# Patient Record
Sex: Male | Born: 1960
Health system: Southern US, Community
[De-identification: ages and names within clinical notes are randomized; demographics above are authoritative.]

## PROBLEM LIST (undated history)

## (undated) DIAGNOSIS — M549 Dorsalgia, unspecified: Secondary | ICD-10-CM

## (undated) DIAGNOSIS — T7840XA Allergy, unspecified, initial encounter: Secondary | ICD-10-CM

## (undated) DIAGNOSIS — R7301 Impaired fasting glucose: Secondary | ICD-10-CM

## (undated) DIAGNOSIS — I10 Essential (primary) hypertension: Secondary | ICD-10-CM

## (undated) DIAGNOSIS — Z973 Presence of spectacles and contact lenses: Secondary | ICD-10-CM

## (undated) DIAGNOSIS — E785 Hyperlipidemia, unspecified: Secondary | ICD-10-CM

## (undated) DIAGNOSIS — E782 Mixed hyperlipidemia: Secondary | ICD-10-CM

## (undated) DIAGNOSIS — M1712 Unilateral primary osteoarthritis, left knee: Secondary | ICD-10-CM

## (undated) DIAGNOSIS — Z8249 Family history of ischemic heart disease and other diseases of the circulatory system: Secondary | ICD-10-CM

## (undated) DIAGNOSIS — E669 Obesity, unspecified: Secondary | ICD-10-CM

## (undated) HISTORY — DX: Impaired fasting glucose: R73.01

## (undated) HISTORY — DX: Obesity, unspecified: E66.9

## (undated) HISTORY — DX: Unilateral primary osteoarthritis, left knee: M17.12

## (undated) HISTORY — DX: Dorsalgia, unspecified: M54.9

## (undated) HISTORY — DX: Family history of ischemic heart disease and other diseases of the circulatory system: Z82.49

## (undated) HISTORY — DX: Allergy, unspecified, initial encounter: T78.40XA

## (undated) HISTORY — DX: Presence of spectacles and contact lenses: Z97.3

## (undated) HISTORY — DX: Essential (primary) hypertension: I10

## (undated) HISTORY — DX: Mixed hyperlipidemia: E78.2

## (undated) HISTORY — PX: WISDOM TOOTH EXTRACTION: SHX21

## (undated) HISTORY — PX: KNEE CARTILAGE SURGERY: SHX688

## (undated) HISTORY — PX: TONSILLECTOMY: SUR1361

## (undated) HISTORY — PX: OTHER SURGICAL HISTORY: SHX169

---
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
## 1988-12-02 HISTORY — PX: ELBOW SURGERY: SHX618

## 1998-12-02 HISTORY — PX: OTHER SURGICAL HISTORY: SHX169

## 2000-06-13 ENCOUNTER — Encounter: Payer: Self-pay | Admitting: Orthopedic Surgery

## 2000-06-13 ENCOUNTER — Ambulatory Visit (HOSPITAL_COMMUNITY): Admission: RE | Admit: 2000-06-13 | Discharge: 2000-06-13 | Payer: Self-pay | Admitting: Orthopedic Surgery

## 2003-12-03 DIAGNOSIS — E782 Mixed hyperlipidemia: Secondary | ICD-10-CM

## 2003-12-03 HISTORY — DX: Mixed hyperlipidemia: E78.2

## 2008-12-02 HISTORY — PX: COLONOSCOPY: SHX174

## 2014-04-04 ENCOUNTER — Encounter (HOSPITAL_COMMUNITY): Payer: Self-pay | Admitting: Emergency Medicine

## 2014-04-04 ENCOUNTER — Emergency Department (HOSPITAL_COMMUNITY)
Admission: EM | Admit: 2014-04-04 | Discharge: 2014-04-04 | Disposition: A | Discharge status: Home / Self Care | Payer: 59 | Source: Home / Self Care

## 2014-04-04 DIAGNOSIS — M6740 Ganglion, unspecified site: Secondary | ICD-10-CM

## 2014-04-04 NOTE — ED Notes (Signed)
Cyst on R thumb x 3 weeks.  No known injury.  Not painful.

## 2014-04-04 NOTE — Discharge Instructions (Signed)
Ganglion Cyst °A ganglion cyst is a noncancerous, fluid-filled lump that occurs near joints or tendons. The ganglion cyst grows out of a joint or the lining of a tendon. It most often develops in the hand or wrist but can also develop in the shoulder, elbow, hip, knee, ankle, or foot. The round or oval ganglion can be pea sized or larger than a grape. Increased activity may enlarge the size of the cyst because more fluid starts to build up.  °CAUSES  °It is not completely known what causes a ganglion cyst to grow. However, it may be related to: °· Inflammation or irritation around the joint. °· An injury. °· Repetitive movements or overuse. °· Arthritis. °SYMPTOMS  °A lump most often appears in the hand or wrist, but can occur in other areas of the body. Generally, the lump is painless without other symptoms. However, sometimes pain can be felt during activity or when pressure is applied to the lump. The lump may even be tender to the touch. Tingling, pain, numbness, or muscle weakness can occur if the ganglion cyst presses on a nerve. Your grip may be weak and you may have less movement in your joints.  °DIAGNOSIS  °Ganglion cysts are most often diagnosed based on a physical exam, noting where the cyst is and how it looks. Your caregiver will feel the lump and may shine a light alongside it. If it is a ganglion, a light often shines through it. Your caregiver may order an X-ray, ultrasound, or MRI to rule out other conditions. °TREATMENT  °Ganglions usually go away on their own without treatment. If pain or other symptoms are involved, treatment may be needed. Treatment is also needed if the ganglion limits your movement or if it gets infected. Treatment options include: °· Wearing a wrist or finger brace or splint. °· Taking anti-inflammatory medicine. °· Draining fluid from the lump with a needle (aspiration). °· Injecting a steroid into the joint. °· Surgery to remove the ganglion cyst and its stalk that is  attached to the joint or tendon. However, ganglion cysts can grow back. °HOME CARE INSTRUCTIONS  °· Do not press on the ganglion, poke it with a needle, or hit it with a heavy object. You may rub the lump gently and often. Sometimes fluid moves out of the cyst. °· Only take medicines as directed by your caregiver. °· Wear your brace or splint as directed by your caregiver. °SEEK MEDICAL CARE IF:  °· Your ganglion becomes larger or more painful. °· You have increased redness, red streaks, or swelling. °· You have pus coming from the lump. °· You have weakness or numbness in the affected area. °MAKE SURE YOU:  °· Understand these instructions. °· Will watch your condition. °· Will get help right away if you are not doing well or get worse. °Document Released: 11/15/2000 Document Revised: 08/12/2012 Document Reviewed: 01/12/2008 °[employer]® Patient Information ©2014 [employer]. ° °

## 2014-04-04 NOTE — ED Provider Notes (Signed)
CSN: [id number]     Arrival date & time 04/04/14  1633 History   None    Chief Complaint  Patient presents with  . Cyst   (Consider location/radiation/quality/duration/timing/severity/associated sxs/prior Treatment) HPI Comments: Patient reports mildly tender cyst at right thumb x 3 weeks. 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 No known injury or previous episode.  PCP: none Patient is right hand dominant and is Glass blower/designer at [employer].   The history is provided by the patient.    Past Medical History  Diagnosis Date  . High cholesterol    Past Surgical History  Procedure Laterality Date  . Knee cartilage surgery Bilateral 2003, 2005      x 2, R 2005, L 2003  . Elbow Right     cartileg 1980, 1984, 1989  . Elbow surgery Right 1990    move nerve  . Wrist sugery Right 2000     bone spur   Family History  Problem Relation Age of Onset  . Alzheimer's disease Mother   . Hypertension Father    History  Substance Use Topics  . Smoking status: Never Smoker   . Smokeless tobacco: Not on file  . Alcohol Use: Yes     Comment: occasional    Review of Systems  All other systems reviewed and are negative.   Allergies  Review of patient's allergies indicates no known allergies.  Home Medications   Prior to Admission medications   Not on File   BP 157/87  Pulse 87  Temp(Src) 99.2 F (37.3 C) (Oral)  Resp 18  SpO2 97% Physical Exam  Nursing note and vitals reviewed. Constitutional: He is oriented to person, place, and time. He appears well-developed and well-nourished. No distress.  HENT:  Head: Normocephalic and atraumatic.  Eyes: Conjunctivae are normal. No scleral icterus.  Cardiovascular: Normal rate.   Pulmonary/Chest: Effort normal.  Musculoskeletal: Normal range of motion.       Hands: Neurological: He is alert and oriented to person, place, and time.    Skin: Skin is warm and dry. No rash noted.  Psychiatric: He has a normal mood and affect. His behavior is normal.    ED Course  Procedures (including critical care time) Labs Review Labs Reviewed - No data to display  Imaging Review No results found.   MDM   1. Ganglion cyst    No evidence of skin abscess or need for urgent I&D. No evidence of infection or fluctuance. Will refer to hand surgery if patient wishes to have lesion biopsied or removed.   [place], [place] 04/04/14 [phone]

## 2014-04-05 NOTE — ED Provider Notes (Signed)
Medical screening examination/treatment/procedure(s) were performed by resident physician or non-physician practitioner and as supervising physician I was immediately available for consultation/collaboration.   Pauline Good MD.   Billy Fischer, MD 04/05/14 [phone]

## 2014-08-02 ENCOUNTER — Ambulatory Visit (INDEPENDENT_AMBULATORY_CARE_PROVIDER_SITE_OTHER): Payer: 59 | Admitting: Medical

## 2014-08-02 ENCOUNTER — Encounter: Payer: Self-pay | Admitting: Medical

## 2014-08-02 VITALS — BP 122/80 | HR 78 | Temp 98.3°F | Resp 14 | Ht 72.0 in | Wt 228.0 lb

## 2014-08-02 DIAGNOSIS — Z23 Encounter for immunization: Secondary | ICD-10-CM

## 2014-08-02 DIAGNOSIS — Z125 Encounter for screening for malignant neoplasm of prostate: Secondary | ICD-10-CM

## 2014-08-02 DIAGNOSIS — Z0000 Encounter for general adult medical examination without abnormal findings: Secondary | ICD-10-CM

## 2014-08-02 DIAGNOSIS — Z8249 Family history of ischemic heart disease and other diseases of the circulatory system: Secondary | ICD-10-CM

## 2014-08-02 DIAGNOSIS — E782 Mixed hyperlipidemia: Secondary | ICD-10-CM

## 2014-08-02 DIAGNOSIS — Z6830 Body mass index (BMI) 30.0-30.9, adult: Secondary | ICD-10-CM

## 2014-08-02 LAB — CBC
HCT: 46.3 % (ref 39.0–52.0)
Hemoglobin: 15.9 g/dL (ref 13.0–17.0)
MCH: 28.4 pg (ref 26.0–34.0)
MCHC: 34.3 g/dL (ref 30.0–36.0)
MCV: 82.7 fL (ref 78.0–100.0)
Platelets: 271 10*3/uL (ref 150–400)
RBC: 5.6 MIL/uL (ref 4.22–5.81)
RDW: 13.4 % (ref 11.5–15.5)
WBC: 7.3 10*3/uL (ref 4.0–10.5)

## 2014-08-02 LAB — POCT URINALYSIS DIPSTICK
Bilirubin, UA: NEGATIVE
Blood, UA: NEGATIVE
Clarity, UA: NEGATIVE
Glucose, UA: NEGATIVE
Ketones, UA: NEGATIVE
Leukocytes, UA: NEGATIVE
Nitrite, UA: NEGATIVE
Protein, UA: NEGATIVE
Spec Grav, UA: <=1.005
Urobilinogen, UA: NEGATIVE
pH, UA: 5

## 2014-08-02 NOTE — Progress Notes (Addendum)
Subjective:   HPI  Steven Fletcher is a 53 y.o. male who presents for a complete physical.  New patient today.   Was living in [place], moved back to [place] recently.    Preventative care: Last ophthalmology visit:looking for new doctor- last eye exam 2014 Last dental visit:yes appointment 12/2014 Last colonoscopy:5 years ago in [place] Last prostate exam: ? Last VPX:TGGYI ago Last labs:new  Prior vaccinations: TD or Tdap:08/02/14 Influenza:will get at his job Pneumococcal:n/a Shingles/Zostavax:n/a  Advanced directive:yes Health care power of attorney:yes Living will:yes  Concerns: Few skin lesions - right chest, left cheek, left thigh, x few years, unchanged.  Reviewed their medical, surgical, family, social, medication, and allergy history and updated chart as appropriate.  Past Medical History  Diagnosis Date  . Mixed dyslipidemia 2005    hereditary  . Wears glasses   . Allergy     seasonal   . Back pain     intermittent pain, sees Dr. Limmie Patricia, Chiropractic    Past Surgical History  Procedure Laterality Date  . Knee cartilage surgery Bilateral 2003, 2005      x 2, R 2005, L 2003  . Elbow Right     arthroscopic for cartilege 1980, 1984, 1989  . Elbow surgery Right 1990    move nerve  . Wrist sugery Right 2000     bone spur  . Tonsillectomy    . Wisdom tooth extraction    . Colonoscopy  2010    [place]; normal, repeat 2020    History   Social History  . Marital Status: Legally Separated    Spouse Name: N/A    Number of Children: N/A  . Years of Education: N/A   Occupational History  . 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 Smoking status: Never Smoker   . Smokeless tobacco: Not on file  . Alcohol Use: 0.6 oz/week    1 Cans of beer per week     Comment: occasional  . Drug Use: No  . Sexual Activity: Not Currently   Other Topics Concern  . Not on file   Social History Narrative   Exercise - [employer], walks,  bikes, Engineer, structural.    Family History  Problem Relation Age of Onset  . Alzheimer's disease Mother   . Hyperlipidemia Mother   . Hypertension Father   . Other Father     died of old age  . Cancer Sister     breast  . Hyperlipidemia Sister   . Heart disease Brother 11    died of MI  . Hyperlipidemia Brother   . Diabetes Maternal Grandmother   . Diabetes Paternal Grandmother   . Stroke Neg Hx     Current outpatient prescriptions:gemfibrozil (LOPID) 600 MG tablet, Take 600 mg by mouth 2 (two) times daily before a meal., Disp: , Rfl: ;  niacin (NIASPAN) 1000 MG CR tablet, Take 1,000 mg by mouth at bedtime., Disp: , Rfl:   No Known Allergies     Review of Systems Constitutional: -fever, -chills, -sweats, -unexpected weight change, -decreased appetite, -fatigue Allergy: -sneezing, -itching, -congestion Dermatology: -changing moles, --rash, +lumps ENT: -runny nose, -ear pain, -sore throat, -hoarseness, -sinus pain, -teeth pain, - ringing in ears, -hearing loss, -nosebleeds Cardiology: -chest pain, -palpitations, -swelling, -difficulty breathing when lying flat, -waking up short of breath Respiratory: -cough, -shortness of breath, -difficulty breathing with exercise or exertion, -wheezing, -coughing up blood Gastroenterology: -abdominal pain, -nausea, -vomiting, -diarrhea, -constipation, -blood in  stool, -changes in bowel movement, -difficulty swallowing or eating Hematology: -bleeding, -bruising  Musculoskeletal: -joint aches, -muscle aches, -joint swelling, -back pain, -neck pain, -cramping, -changes in gait Ophthalmology: denies vision changes, eye redness, itching, discharge Urology: -burning with urination, -difficulty urinating, -blood in urine, -urinary frequency, -urgency, -incontinence Neurology: -headache, -weakness, -tingling, -numbness, -memory loss, -falls, -dizziness Psychology: -depressed mood, -agitation, -sleep problems     Objective:   Physical Exam  BP  122/80  Pulse 78  Temp(Src) 98.3 F (36.8 C) (Oral)  Resp 14  Ht 6' (1.829 m)  Wt 228 lb (103.42 kg)  BMI 30.92 kg/m2   General appearance: alert, no distress, WD/WN, white male Skin: scattered macules, left anterior thigh with 2 small yellow/brown 35mm slightly raised papules, similar right chest upper wall yellow brown 13mm faint raised broad topped lesion, no particular worrisome lesions HEENT: normocephalic, conjunctiva/corneas normal, sclerae anicteric, PERRLA, EOMi, nares patent, no discharge or erythema, pharynx normal Oral cavity: MMM, tongue normal, teeth normal Neck: supple, no lymphadenopathy, no thyromegaly, no masses, normal ROM, no bruits Chest: non tender, normal shape and expansion Heart: RRR, normal S1, S2, no murmurs Lungs: CTA bilaterally, no wheezes, rhonchi, or rales Abdomen: +bs, soft, non tender, non distended, no masses, no hepatomegaly, no splenomegaly, no bruits Back: non tender, normal ROM, no scoliosis Musculoskeletal: right dorsal wrist laterally with small surgical scar, right medial and lateral elbow surgical scars,  anterior port surgical scars both knees, upper extremities non tender, no obvious deformity, normal ROM throughout, lower extremities non tender, no obvious deformity, normal ROM throughout Extremities: no edema, no cyanosis, no clubbing Pulses: 2+ symmetric, upper and lower extremities, normal cap refill Neurological: alert, oriented x 3, CN2-12 intact, strength normal upper extremities and lower extremities, sensation normal throughout, DTRs 2+ throughout, no cerebellar signs, gait normal Psychiatric: normal affect, behavior normal, pleasant  GU: normal male external genitalia, circumcised, nontender, no masses, no hernia, no lymphadenopathy Rectal: anus normal, normal tone, prostate WNL, occult negative stool   Adult ECG Report  Indication: premature family hx/o heart disease, physical  Rate: 73 bpm  Rhythm: normal sinus rhythm  QRS Axis:  51 degrees  PR Interval: 188bpm  QRS Duration: 112 ms  QTc: 414ms  Conduction Disturbances: none  Other Abnormalities: none  Patient's cardiac risk factors are: advanced age (older than 39 for men, 72 for women), dyslipidemia, family history of premature cardiovascular disease, male gender and obesity (BMI >= 30 kg/m2).  EKG comparison: none  Narrative Interpretation: normal EKG     Assessment and Plan :      Encounter Diagnoses  Name Primary?  . Routine general medical examination at a health care facility Yes  . Family history of premature coronary artery disease   . Mixed dyslipidemia   . Screening for prostate cancer   . Need for Tdap vaccination   . BMI 30.0-30.9,adult     Physical exam - discussed healthy lifestyle, diet, exercise, preventative care, vaccinations, and addressed their concerns.    See eye doctor and dentist yearly.  Flu shot at work soon.  Counseled on the Tdap (tetanus, diptheria, and acellular pertussis) vaccine.  Vaccine information sheet given. Tdap vaccine given after consent obtained.  Work on some weight loss through healthy diet and exercise.  Labs today.  Follow-up pending labs

## 2014-08-02 NOTE — Patient Instructions (Signed)
Thank you for giving me the opportunity to serve you today.    Your diagnosis today includes: Encounter Diagnoses  Name Primary?  . Routine general medical examination at a health care facility Yes  . Family history of premature coronary artery disease   . Mixed dyslipidemia   . Screening for prostate cancer   . Need for Tdap vaccination   . BMI 30.0-30.9,adult      Specific recommendations today include:  Work on losing a little weight through healthy diet and exercise  See the eye doctor and dentist yearly  We updated your tetanus, diphtheria, and pertussis vaccine today which is good for 10 years  We will call lab results and additional information  Return pending labs.    I have included other useful information below for your review.  Dyslipidemia Dyslipidemia is an imbalance of the lipids in your blood. Lipids are waxy, fat-like proteins that your body needs in small amounts. Dyslipidemia often involves the lipids cholesterol or triglycerides. Common forms of dyslipidemia are:  High levels of bad cholesterol (LDL cholesterol). LDL cholesterol is the type of cholesterol that causes heart disease.  Low levels of good cholesterol (HDL cholesterol). HDL cholesterol is the type of cholesterol that helps protect against heart disease.  High levels of triglycerides. Triglycerides are a fatty substance in the blood linked to a buildup of plaque on your arteries. RISK FACTORS  Increased age.  Having a family history of high cholesterol.  Certain medicines, including birth control pills, diuretics, beta-blockers, and some medicines for depression.  Smoking.  Eating a high-fat diet.  Being overweight.  Medical conditions such as diabetes, polycystic ovary syndrome, pregnancy, kidney disease, and hypothyroidism.  Lack of regular exercise. SIGNS AND SYMPTOMS There are no signs or symptoms with dyslipidemia.  DIAGNOSIS  A simple blood test called a fasting blood  test can be done to determine your level of:  Total cholesterol. This is the combined number of LDL cholesterol and HDL cholesterol. A healthy number is lower than 200.  LDL cholesterol. The goal number for LDL cholesterol is different for each person depending on risk factors. Ask your health care provider what your LDL cholesterol number should be.  HDL cholesterol. A healthy level of HDL cholesterol is 60 or higher. A number lower than 40 for men or 50 for women is a danger sign.  Triglycerides. A healthy triglyceride number is less than 150. TREATMENT  Dyslipidemia is a treatable condition. Your health care provider will advise you on what type of treatment is best based on your age, your test results, and current guidelines. Treatment may include:   Dietary changes. A dietitian can help you create a meal plan. You may need to:  Eat more foods that contain omega-3s, such as salmon and other fish.  Replace saturated fats and trans fats in your diet with healthy fats such as nuts, seeds, avocados, olive oil, and canola oil.  Regular exercise. This can help lower your LDL cholesterol, raise your HDL cholesterol, and help with weight management. Check with your health care provider before beginning an exercise program. Most people should participate in 30 minutes of brisk exercise 5 days a week.  Quitting smoking.  Medicines to lower LDL cholesterol and triglycerides. Your health care provider will monitor your lipid levels with regular blood tests. HOME CARE INSTRUCTIONS  Eat a healthy diet. Follow any diet instructions if they were given to you by your health care provider.  Maintain a healthy weight.  Exercise  regularly based on the recommendations of your health care provider.  Do not use any tobacco products, including cigarettes, chewing tobacco, or electronic cigarettes.  Take medicines only as directed by your health care provider.  Keep all follow-up visits as directed by  your health care provider. SEEK MEDICAL CARE IF: You are having possible side effects from your medicines. Document Released: 11/23/2013 Document Revised: 04/04/2014 Document Reviewed: 11/23/2013 [facility] Patient Information 2015 [place], [place]. This information is not intended to replace advice given to you by your health care provider. Make sure you discuss any questions you have with your health care provider.

## 2014-08-03 LAB — LIPID PANEL
Cholesterol: 265 mg/dL — ABNORMAL HIGH (ref 0–200)
HDL: 38 mg/dL — ABNORMAL LOW (ref >39)
LDL Cholesterol: 157 mg/dL — ABNORMAL HIGH (ref 0–99)
Total CHOL/HDL Ratio: 7 Ratio
Triglycerides: 349 mg/dL — ABNORMAL HIGH (ref <150)
VLDL: 70 mg/dL — ABNORMAL HIGH (ref 0–40)

## 2014-08-03 LAB — COMPREHENSIVE METABOLIC PANEL
ALT: 18 U/L (ref 0–53)
AST: 26 U/L (ref 0–37)
Albumin: 5 g/dL (ref 3.5–5.2)
Alkaline Phosphatase: 61 U/L (ref 39–117)
BUN: 17 mg/dL (ref 6–23)
CO2: 23 mEq/L (ref 19–32)
Calcium: 9.8 mg/dL (ref 8.4–10.5)
Chloride: 102 mEq/L (ref 96–112)
Creat: 0.84 mg/dL (ref 0.50–1.35)
Glucose, Bld: 109 mg/dL — ABNORMAL HIGH (ref 70–99)
Potassium: 4.6 mEq/L (ref 3.5–5.3)
Sodium: 137 mEq/L (ref 135–145)
Total Bilirubin: 0.7 mg/dL (ref 0.2–1.2)
Total Protein: 8 g/dL (ref 6.0–8.3)

## 2014-08-03 LAB — PSA: PSA: 2.97 ng/mL (ref <=4.00)

## 2014-08-04 ENCOUNTER — Other Ambulatory Visit: Payer: Self-pay | Admitting: Medical

## 2014-08-04 MED ORDER — NIACIN ER (ANTIHYPERLIPIDEMIC) 1000 MG PO TBCR: 1000.0000 mg | EXTENDED_RELEASE_TABLET | Freq: Every day | ORAL | Status: DC

## 2014-08-04 MED ORDER — ROSUVASTATIN CALCIUM 20 MG PO TABS: 20.0000 mg | ORAL_TABLET | Freq: Every day | ORAL | Status: DC

## 2014-10-19 ENCOUNTER — Telehealth: Payer: Self-pay | Admitting: Medical

## 2014-10-20 ENCOUNTER — Other Ambulatory Visit: Payer: Self-pay | Admitting: Medical

## 2014-10-20 DIAGNOSIS — R7301 Impaired fasting glucose: Secondary | ICD-10-CM

## 2014-10-20 DIAGNOSIS — E782 Mixed hyperlipidemia: Secondary | ICD-10-CM

## 2014-10-20 NOTE — Telephone Encounter (Signed)
Have him come in first FASTING for labs only/nurse visit, but make sure it has been over 6 weeks since he started the new cholesterol regimen: Niacin + Crestor.  Orders are in the computer

## 2014-10-20 NOTE — Telephone Encounter (Signed)
Left message for pt

## 2014-11-03 ENCOUNTER — Other Ambulatory Visit: Payer: 59

## 2014-11-03 DIAGNOSIS — R7301 Impaired fasting glucose: Secondary | ICD-10-CM

## 2014-11-03 DIAGNOSIS — E782 Mixed hyperlipidemia: Secondary | ICD-10-CM

## 2014-11-03 LAB — LIPID PANEL
Cholesterol: 158 mg/dL (ref 0–200)
HDL: 40 mg/dL (ref >39)
LDL CALC: 69 mg/dL (ref 0–99)
TRIGLYCERIDES: 245 mg/dL — AB (ref <150)
Total CHOL/HDL Ratio: 4 Ratio
VLDL: 49 mg/dL — ABNORMAL HIGH (ref 0–40)

## 2014-11-03 LAB — ALT: ALT: 31 U/L (ref 0–53)

## 2014-11-04 LAB — HEMOGLOBIN A1C
Hgb A1c MFr Bld: 6.1 % — ABNORMAL HIGH (ref <5.7)
Mean Plasma Glucose: 128 mg/dL — ABNORMAL HIGH (ref <117)

## 2014-11-07 ENCOUNTER — Encounter: Payer: Self-pay | Admitting: Medical

## 2014-11-10 ENCOUNTER — Encounter: Payer: Self-pay | Admitting: Medical

## 2014-11-10 ENCOUNTER — Ambulatory Visit (INDEPENDENT_AMBULATORY_CARE_PROVIDER_SITE_OTHER): Payer: 59 | Admitting: Medical

## 2014-11-10 VITALS — BP 138/98 | HR 78 | Temp 98.0°F | Resp 15 | Wt 230.0 lb

## 2014-11-10 DIAGNOSIS — Z8249 Family history of ischemic heart disease and other diseases of the circulatory system: Secondary | ICD-10-CM

## 2014-11-10 DIAGNOSIS — R030 Elevated blood-pressure reading, without diagnosis of hypertension: Secondary | ICD-10-CM

## 2014-11-10 DIAGNOSIS — E785 Hyperlipidemia, unspecified: Secondary | ICD-10-CM | POA: Insufficient documentation

## 2014-11-10 DIAGNOSIS — E669 Obesity, unspecified: Secondary | ICD-10-CM

## 2014-11-10 DIAGNOSIS — E782 Mixed hyperlipidemia: Secondary | ICD-10-CM

## 2014-11-10 DIAGNOSIS — R7301 Impaired fasting glucose: Secondary | ICD-10-CM

## 2014-11-10 MED ORDER — ROSUVASTATIN CALCIUM 20 MG PO TABS
20.0000 mg | ORAL_TABLET | Freq: Every day | ORAL | Status: DC
Start: 2014-11-10 — End: 2015-06-07

## 2014-11-10 MED ORDER — ICOSAPENT ETHYL 1 G PO CAPS: 2.0000 g | ORAL_CAPSULE | Freq: Two times a day (BID) | ORAL | Status: DC

## 2014-11-10 MED ORDER — ASPIRIN EC 81 MG PO TBEC: 81.0000 mg | DELAYED_RELEASE_TABLET | Freq: Every day | ORAL | Status: DC

## 2014-11-10 MED ORDER — NIACIN ER (ANTIHYPERLIPIDEMIC) 1000 MG PO TBCR: 1000.0000 mg | EXTENDED_RELEASE_TABLET | Freq: Every day | ORAL | Status: DC

## 2014-11-10 NOTE — Patient Instructions (Signed)
Thank you for giving me the opportunity to serve you today.    Your diagnosis today includes: Encounter Diagnoses  Name Primary?  . Mixed dyslipidemia Yes  . Impaired fasting blood sugar   . Family history of premature CAD   . Elevated blood pressure reading without diagnosis of hypertension   . Obesity      Specific recommendations today include:  I am glad to see the improvements in the lipids.  You do have elevated glucose, which puts you borderline for diabetes  Blood pressure is also elevated today  I want you to continue working to improve on exercise, diet, and losing some weight  Continue Crestor, continue Niaspan, continue Aspirin daily 81mg   Begin Vasecpa, 2 capsules twice daily to help lower triglycerides and to improve the HDL cholesterol  Lets see you back in 6 months, fasting  Return 31mo.    I have included other useful information below for your review.  Metabolic Syndrome, Adult Metabolic syndrome describes a group of risk factors for heart disease and diabetes. This syndrome has other names including Insulin Resistance Syndrome. The more risk factors you have, the higher your risk of having a heart attack, stroke, or developing diabetes. These risk factors include:  High blood sugar.  High blood triglyceride (a fat found in the blood) level.  High blood pressure.  Abdominal obesity (your extra weight is around your waist instead of your hips).  Low levels of high-density lipoprotein, HDL (good blood cholesterol). If you have any three of these risk factors, you have metabolic syndrome. If you have even one of these factors, you should make lifestyle changes to improve your health in order to prevent serious health diseases.  In people with metabolic syndrome, the cells do not respond properly to insulin. This can lead to high levels of glucose in the blood, which can interfere with normal body processes. Eventually, this can cause high blood  pressure and higher fat levels in the blood, and inflammation of your blood vessels. The result can be heart disease and stroke.  CAUSES   Eating a diet rich in calories and saturated fat.  Too little physical activity.  Being overweight. Other underlying causes are:  Family history (genetics).  Ethnicity (South Asians are at a higher risk).  Older age (your chances of developing metabolic syndrome are higher as you grow older).  Insulin resistance. SYMPTOMS  By itself, metabolic syndrome has no symptoms. However, you might have symptoms of diabetes (high blood sugar) or high blood pressure, such as:  Increased thirst, urination, and tiredness.  Dizzy spells.  Dull headaches that are unusual for you.  Blurred vision.  Nosebleeds. DIAGNOSIS  Your caregiver may make a diagnosis of metabolic syndrome if you have at least three of these factors:  If you are overweight mostly around the waist. This means a waistline greater than 40" in men and more than 35" in women. The waistline limits are 31 to 35 inches for women and 37 to 39 inches for men. In those who have certain genetic risk factors, such as having a family history of diabetes or being of Asian descent.  If you have a blood pressure of 130/85 mm Hg or more, or if you are being treated for high blood pressure.  If your blood triglyceride level is 150 mg/dL or more, or you are being treated for high levels of triglyceride.  If the level of HDL in your blood is below 40 mg/dL in men, less than 50  mg/dL in women, or you are receiving treatment for low levels of HDL.  If the level of sugar in your blood is high with fasting blood sugar level of 110 mg/dL or more, or you are under treatment for diabetes. TREATMENT  Your caregiver may have you make lifestyle changes, which may include:  Exercise.  Losing weight.  Maintaining a healthy diet.  Quitting smoking. The lifestyle changes listed above are key in reducing your  risk for heart disease and stroke. Medicines may also be prescribed to help your body respond to insulin better and to reduce your blood pressure and blood fat levels. Aspirin may be recommended to reduce risks of heart disease or stroke.  HOME CARE INSTRUCTIONS   Exercise.  Measure your waist at regular intervals just above the hipbones after you have breathed out.  Maintain a healthy diet.  Eat fruits, such as apples, oranges, and pears.  Eat vegetables.  Eat legumes, such as kidney beans, peas, and lentils.  Eat food rich in soluble fiber, such as whole grain cereal, oatmeal, and oat bran.  Use olive or safflower oils and avoid saturated fats.  Eat nuts.  Limit the amount of salt you eat or add to food.  Limit the amount of alcohol you drink.  Include fish in your diet, if possible.  Stop smoking if you are a smoker.  Maintain regular follow-up appointments.  Follow your caregiver's advice. SEEK MEDICAL CARE IF:   You feel very tired or fatigued.  You develop excessive thirst.  You pass large quantities of urine.  You are putting on weight around your waist rather than losing weight.  You develop headaches over and over again.  You have off-and-on dizzy spells. SEEK IMMEDIATE MEDICAL CARE IF:   You develop nosebleeds.  You develop sudden blurred vision.  You develop sudden dizzy spells.  You develop chest pains, trouble breathing, or feel an abnormal or irregular heart beat.  You have a fainting episode.  You develop any sudden trouble speaking and/or swallowing.  You develop sudden weakness in one arm and/or one leg. MAKE SURE YOU:   Understand these instructions.  Will watch your condition.  Will get help right away if you are not doing well or get worse. Document Released: 02/25/2008 Document Revised: 04/04/2014 Document Reviewed: 02/25/2008 [facility] Patient Information 2015 [place], [place]. This information is not intended to replace advice  given to you by your health care provider. Make sure you discuss any questions you have with your health care provider.

## 2014-11-10 NOTE — Progress Notes (Signed)
Subjective: Here for f/u on lipids.  Came here as a new patient in September 2015.   After labs from that visit, we stopped Lopid, c/t Niaspan, and changed to Crestor.  Started aspirin 81mg  QHS.  Compliance - excellent.  Prior other medication include Lopid, Niaspan, Crestor, Lipitor, others too but can't recall the name.  No prior side effects of the medication.  Diet - mostly good, could be some better.  Tries to eat salad daily.  Exercise - working 6 days per week, 12 hour days.   Trying to get more exercise, after another week of 6 days per week work, plans to get back to exercise more.   Patient's cardiac risk factors are: advanced age (older than 36 for men, 26 for women), dyslipidemia, family history of premature cardiovascular disease, male gender, obesity (BMI >= 30 kg/m2) and prediabetic, low HDL.   Past Medical History  Diagnosis Date  . Mixed dyslipidemia 2005    hereditary  . Wears glasses   . Allergy     seasonal   . Back pain     intermittent pain, sees Dr. Limmie Patricia, Chiropractic   ROS as in subjective   Objective: Filed Vitals:   11/10/14 0825  BP: 138/98  Pulse: 78  Temp: 98 F (36.7 C)  Resp: 15   Wt Readings from Last 3 Encounters:  11/10/14 230 lb (104.327 kg)  08/02/14 228 lb (103.42 kg)   BP Readings from Last 3 Encounters:  11/10/14 138/98  08/02/14 122/80  04/04/14 157/87    General appearance: alert, no distress, WD/WN Neck: supple, no lymphadenopathy, no thyromegaly, no masses Heart: RRR, normal S1, S2, no murmurs Lungs: CTA bilaterally, no wheezes, rhonchi, or rales Abdomen: +bs, soft, non tender, non distended, no masses, no hepatomegaly, no splenomegaly Pulses: 2+ symmetric, upper and lower extremities, normal cap refill Ext: no edema    Assessment: Encounter Diagnoses  Name Primary?  . Mixed dyslipidemia Yes  . Impaired fasting blood sugar   . Obesity   . Family history of premature CAD    Plan: We discussed his recent labs, lipids  much improved with adding Crestor, but not at goal.   C/t current medications, begin Vascepa.  Specific recommendations today include:  I am glad to see the improvements in the lipids.  You do have elevated glucose, which puts you borderline for diabetes  Blood pressure is also elevated today  I want you to continue working to improve on exercise, diet, and losing some weight  Continue Crestor, continue Niaspan, continue Aspirin daily 81mg   Begin Vasecpa, 2 capsules twice daily to help lower triglycerides and to improve the HDL cholesterol  Lets see you back in 6 months, fasting  Return 87mo.

## 2015-05-24 ENCOUNTER — Telehealth: Payer: Self-pay | Admitting: Medical

## 2015-05-24 NOTE — Telephone Encounter (Signed)
Please call patient Patient is due for 6 month fasting follow up, he would like to come in for labs first and then see you Is that ok and if so can you enter orders?

## 2015-05-25 ENCOUNTER — Other Ambulatory Visit: Payer: Self-pay | Admitting: Medical

## 2015-05-25 DIAGNOSIS — Z125 Encounter for screening for malignant neoplasm of prostate: Secondary | ICD-10-CM

## 2015-05-25 DIAGNOSIS — E782 Mixed hyperlipidemia: Secondary | ICD-10-CM

## 2015-05-25 DIAGNOSIS — R7301 Impaired fasting glucose: Secondary | ICD-10-CM

## 2015-05-25 NOTE — Telephone Encounter (Signed)
Labs are in future orders

## 2015-05-26 ENCOUNTER — Other Ambulatory Visit: Payer: 59

## 2015-05-26 DIAGNOSIS — E782 Mixed hyperlipidemia: Secondary | ICD-10-CM

## 2015-05-26 DIAGNOSIS — Z125 Encounter for screening for malignant neoplasm of prostate: Secondary | ICD-10-CM

## 2015-05-26 DIAGNOSIS — R7301 Impaired fasting glucose: Secondary | ICD-10-CM

## 2015-05-26 LAB — CBC
HEMATOCRIT: 46 % (ref 39.0–52.0)
Hemoglobin: 15.9 g/dL (ref 13.0–17.0)
MCH: 28.8 pg (ref 26.0–34.0)
MCHC: 34.6 g/dL (ref 30.0–36.0)
MCV: 83.3 fL (ref 78.0–100.0)
MPV: 9.5 fL (ref 8.6–12.4)
Platelets: 240 10*3/uL (ref 150–400)
RBC: 5.52 MIL/uL (ref 4.22–5.81)
RDW: 13.2 % (ref 11.5–15.5)
WBC: 8.1 10*3/uL (ref 4.0–10.5)

## 2015-05-26 LAB — HEMOGLOBIN A1C
HEMOGLOBIN A1C: 6 % — AB (ref <5.7)
Mean Plasma Glucose: 126 mg/dL — ABNORMAL HIGH (ref <117)

## 2015-05-27 LAB — LIPID PANEL
CHOLESTEROL: 169 mg/dL (ref 0–200)
HDL: 36 mg/dL — ABNORMAL LOW (ref >=40)
LDL Cholesterol: 93 mg/dL (ref 0–99)
Total CHOL/HDL Ratio: 4.7 Ratio
Triglycerides: 199 mg/dL — ABNORMAL HIGH (ref <150)
VLDL: 40 mg/dL (ref 0–40)

## 2015-05-27 LAB — COMPREHENSIVE METABOLIC PANEL
ALBUMIN: 4.4 g/dL (ref 3.5–5.2)
ALT: 21 U/L (ref 0–53)
AST: 29 U/L (ref 0–37)
Alkaline Phosphatase: 61 U/L (ref 39–117)
BUN: 14 mg/dL (ref 6–23)
CALCIUM: 9.6 mg/dL (ref 8.4–10.5)
CHLORIDE: 100 meq/L (ref 96–112)
CO2: 28 mEq/L (ref 19–32)
CREATININE: 0.84 mg/dL (ref 0.50–1.35)
Glucose, Bld: 111 mg/dL — ABNORMAL HIGH (ref 70–99)
POTASSIUM: 4.4 meq/L (ref 3.5–5.3)
SODIUM: 140 meq/L (ref 135–145)
TOTAL PROTEIN: 7.4 g/dL (ref 6.0–8.3)
Total Bilirubin: 1 mg/dL (ref 0.2–1.2)

## 2015-05-27 LAB — PSA: PSA: 3.12 ng/mL (ref <=4.00)

## 2015-05-29 NOTE — Telephone Encounter (Signed)
I left the patient a voicemail about the labs are ready and he will need to schedule a lab visit.

## 2015-06-07 ENCOUNTER — Encounter: Payer: Self-pay | Admitting: Medical

## 2015-06-07 ENCOUNTER — Ambulatory Visit (INDEPENDENT_AMBULATORY_CARE_PROVIDER_SITE_OTHER): Payer: 59 | Admitting: Medical

## 2015-06-07 VITALS — BP 150/100 | HR 80 | Resp 14 | Wt 237.0 lb

## 2015-06-07 DIAGNOSIS — E669 Obesity, unspecified: Secondary | ICD-10-CM | POA: Diagnosis not present

## 2015-06-07 DIAGNOSIS — Z8249 Family history of ischemic heart disease and other diseases of the circulatory system: Secondary | ICD-10-CM

## 2015-06-07 DIAGNOSIS — E782 Mixed hyperlipidemia: Secondary | ICD-10-CM | POA: Diagnosis not present

## 2015-06-07 DIAGNOSIS — I10 Essential (primary) hypertension: Secondary | ICD-10-CM

## 2015-06-07 DIAGNOSIS — R5383 Other fatigue: Secondary | ICD-10-CM

## 2015-06-07 DIAGNOSIS — R7301 Impaired fasting glucose: Secondary | ICD-10-CM | POA: Diagnosis not present

## 2015-06-07 MED ORDER — ROSUVASTATIN CALCIUM 20 MG PO TABS: 20.0000 mg | ORAL_TABLET | Freq: Every day | ORAL | Status: DC

## 2015-06-07 MED ORDER — OMEGA-3-ACID ETHYL ESTERS 1 G PO CAPS: 2.0000 g | ORAL_CAPSULE | Freq: Two times a day (BID) | ORAL | Status: DC

## 2015-06-07 NOTE — Patient Instructions (Signed)
Recommendations  Continue Crestor daily at bedtime  Continue Aspirin daily at bedtime  STOP niacin as it is not working  Change from [place] to [place] and lets see if this is cheaper  Check blood pressure 3 times a week at random times and write these numbers down.   Goal is 130/80 or less  STOP zyrtec D and change to plain Zyrtec without the D  Eat healthy Mediterranean diet  Exercise daily  Consider a hobby  I invite you to church, [employer], get plugged in to things outside of work  Good luck on your job interviews today  We will call with lab results

## 2015-06-07 NOTE — Progress Notes (Signed)
Subjective: Here for f/u.  At his last visit in 11/2014, he continued Crestor and Niacin, started Vasecpa, continue Aspirin for mixed dyslipidemia and family hx/o premature CAD.  He has had elevated blood pressures last 2 visits.  Doesn't check BPs at home.      Takes zyrtec D for allergies, on average every other day.  Exercise - going to [employer], usually weekends, hard to make time for this due to work schedule, but does walk a lot at [employer].   Diet - not too bad, just got back from vacation, just went to [facility] with sister, ate quite a bit.     Has some stress in life.  No energy on the weekends.   Still trying to settle divorce after 3 years.   Not happy with work schedule, has interviews today.    He came here as a new patient in September 2015.   After labs from that visit, we stopped Lopid, c/t Niaspan, and changed to Crestor.  Prior other medication include Lopid, Niaspan, Crestor, Lipitor, others too but can't recall the name.  No prior side effects of the medication.   Patient's cardiac risk factors are: advanced age (older than 85 for men, 68 for women), dyslipidemia, family history of premature cardiovascular disease, male gender, obesity (BMI >= 30 kg/m2) and prediabetic, low HDL.   Past Medical History  Diagnosis Date  . Mixed dyslipidemia 2005    hereditary  . Wears glasses   . Allergy     seasonal   . Back pain     intermittent pain, sees Dr. Limmie Patricia, Chiropractic   ROS as in subjective   Objective: BP 150/100 mmHg  Pulse 80  Resp 14  Wt 237 lb (107.502 kg)  Wt Readings from Last 3 Encounters:  06/07/15 237 lb (107.502 kg)  11/10/14 230 lb (104.327 kg)  08/02/14 228 lb (103.42 kg)   BP Readings from Last 3 Encounters:  06/07/15 150/100  11/10/14 138/98  08/02/14 122/80    General appearance: alert, no distress, WD/WN Neck: supple, no lymphadenopathy, no thyromegaly, no masses Heart: RRR, normal S1, S2, no murmurs Lungs: CTA bilaterally, no  wheezes, rhonchi, or rales Pulses: 2+ symmetric, upper and lower extremities, normal cap refill Ext: no edema    Assessment: Encounter Diagnoses  Name Primary?  . Impaired fasting blood sugar Yes  . Mixed dyslipidemia   . Obesity   . Family history of premature CAD    Plan: Discussed his recent labs, his overall happiness and priorities.  Testosterone level today given low energy. Discussed diet, exercise, medications and recommendations below.  Patient Instructions  Recommendations  Continue Crestor daily at bedtime  Continue Aspirin daily at bedtime  STOP niacin as it is not working  Change from [place] to [place] and lets see if this is cheaper  Check blood pressure 3 times a week at random times and write these numbers down.   Goal is 130/80 or less  STOP zyrtec D and change to plain Zyrtec without the D  Eat healthy Mediterranean diet  Exercise daily  Consider a hobby  I invite you to church, [employer], get plugged in to things outside of work  Good luck on your job interviews today  We will call with lab results

## 2015-06-08 ENCOUNTER — Other Ambulatory Visit: Payer: Self-pay | Admitting: Medical

## 2015-06-08 DIAGNOSIS — R7989 Other specified abnormal findings of blood chemistry: Secondary | ICD-10-CM

## 2015-06-08 LAB — TESTOSTERONE: TESTOSTERONE: 233 ng/dL — AB (ref 300–890)

## 2015-06-12 ENCOUNTER — Other Ambulatory Visit: Payer: 59

## 2015-06-12 DIAGNOSIS — R7989 Other specified abnormal findings of blood chemistry: Secondary | ICD-10-CM

## 2015-06-13 ENCOUNTER — Other Ambulatory Visit: Payer: Self-pay | Admitting: Medical

## 2015-06-13 LAB — PROLACTIN: Prolactin: 6.2 ng/mL (ref 2.1–17.1)

## 2015-06-13 LAB — FSH/LH
FSH: 5.6 m[IU]/mL (ref 1.4–18.1)
LH: 3.5 m[IU]/mL (ref 1.5–9.3)

## 2015-06-13 LAB — TESTOSTERONE: Testosterone: 160 ng/dL — ABNORMAL LOW (ref 300–890)

## 2015-06-13 MED ORDER — TESTOSTERONE CYPIONATE 200 MG/ML IM SOLN: 200.0000 mg | INTRAMUSCULAR | Status: DC

## 2015-06-19 ENCOUNTER — Other Ambulatory Visit (INDEPENDENT_AMBULATORY_CARE_PROVIDER_SITE_OTHER): Payer: 59

## 2015-06-19 DIAGNOSIS — E291 Testicular hypofunction: Secondary | ICD-10-CM

## 2015-06-19 DIAGNOSIS — R7989 Other specified abnormal findings of blood chemistry: Secondary | ICD-10-CM

## 2015-06-19 MED ORDER — TESTOSTERONE CYPIONATE 200 MG/ML IM SOLN
200.0000 mg | Freq: Once | INTRAMUSCULAR | Status: AC
  Administered 2015-06-19: 200 mg via INTRAMUSCULAR

## 2015-07-03 ENCOUNTER — Other Ambulatory Visit (INDEPENDENT_AMBULATORY_CARE_PROVIDER_SITE_OTHER): Payer: 59

## 2015-07-03 DIAGNOSIS — E291 Testicular hypofunction: Secondary | ICD-10-CM

## 2015-07-17 ENCOUNTER — Other Ambulatory Visit: Payer: 59

## 2015-07-17 DIAGNOSIS — R7989 Other specified abnormal findings of blood chemistry: Secondary | ICD-10-CM

## 2015-07-18 ENCOUNTER — Other Ambulatory Visit: Payer: 59

## 2015-07-19 ENCOUNTER — Other Ambulatory Visit: Payer: Self-pay

## 2015-07-31 ENCOUNTER — Other Ambulatory Visit (INDEPENDENT_AMBULATORY_CARE_PROVIDER_SITE_OTHER): Payer: 59

## 2015-07-31 DIAGNOSIS — E291 Testicular hypofunction: Secondary | ICD-10-CM

## 2015-07-31 DIAGNOSIS — R7989 Other specified abnormal findings of blood chemistry: Secondary | ICD-10-CM

## 2015-07-31 MED ORDER — TESTOSTERONE CYPIONATE 200 MG/ML IM SOLN
200.0000 mg | Freq: Once | INTRAMUSCULAR | Status: AC
  Administered 2015-07-31: 200 mg via INTRAMUSCULAR

## 2015-08-14 ENCOUNTER — Other Ambulatory Visit (INDEPENDENT_AMBULATORY_CARE_PROVIDER_SITE_OTHER): Payer: 59

## 2015-08-14 DIAGNOSIS — E291 Testicular hypofunction: Secondary | ICD-10-CM | POA: Diagnosis not present

## 2015-08-14 DIAGNOSIS — R7989 Other specified abnormal findings of blood chemistry: Secondary | ICD-10-CM

## 2015-08-14 MED ORDER — TESTOSTERONE CYPIONATE 200 MG/ML IM SOLN
200.0000 mg | Freq: Once | INTRAMUSCULAR | Status: AC
  Administered 2015-08-14: 200 mg via INTRAMUSCULAR

## 2015-08-28 ENCOUNTER — Other Ambulatory Visit (INDEPENDENT_AMBULATORY_CARE_PROVIDER_SITE_OTHER): Payer: 59

## 2015-08-28 DIAGNOSIS — E291 Testicular hypofunction: Secondary | ICD-10-CM | POA: Diagnosis not present

## 2015-08-28 MED ORDER — TESTOSTERONE CYPIONATE 200 MG/ML IM SOLN
200.0000 mg | Freq: Once | INTRAMUSCULAR | Status: AC
  Administered 2015-08-28: 200 mg via INTRAMUSCULAR

## 2015-09-08 ENCOUNTER — Other Ambulatory Visit (INDEPENDENT_AMBULATORY_CARE_PROVIDER_SITE_OTHER): Payer: 59

## 2015-09-08 DIAGNOSIS — E291 Testicular hypofunction: Secondary | ICD-10-CM | POA: Diagnosis not present

## 2015-09-08 DIAGNOSIS — R7989 Other specified abnormal findings of blood chemistry: Secondary | ICD-10-CM

## 2015-09-08 MED ORDER — TESTOSTERONE CYPIONATE 200 MG/ML IM SOLN
200.0000 mg | Freq: Once | INTRAMUSCULAR | Status: AC
  Administered 2015-09-08: 200 mg via INTRAMUSCULAR

## 2015-09-22 ENCOUNTER — Other Ambulatory Visit (INDEPENDENT_AMBULATORY_CARE_PROVIDER_SITE_OTHER): Payer: 59

## 2015-09-22 DIAGNOSIS — R7989 Other specified abnormal findings of blood chemistry: Secondary | ICD-10-CM

## 2015-09-22 DIAGNOSIS — E291 Testicular hypofunction: Secondary | ICD-10-CM | POA: Diagnosis not present

## 2015-09-22 MED ORDER — TESTOSTERONE CYPIONATE 200 MG/ML IM SOLN
200.0000 mg | Freq: Once | INTRAMUSCULAR | Status: AC
  Administered 2015-09-22: 200 mg via INTRAMUSCULAR

## 2015-10-06 ENCOUNTER — Other Ambulatory Visit (INDEPENDENT_AMBULATORY_CARE_PROVIDER_SITE_OTHER): Payer: 59

## 2015-10-06 DIAGNOSIS — R7989 Other specified abnormal findings of blood chemistry: Secondary | ICD-10-CM

## 2015-10-06 DIAGNOSIS — E291 Testicular hypofunction: Secondary | ICD-10-CM

## 2015-10-06 MED ORDER — TESTOSTERONE CYPIONATE 200 MG/ML IM SOLN
200.0000 mg | Freq: Once | INTRAMUSCULAR | Status: AC
  Administered 2015-10-06: 200 mg via INTRAMUSCULAR

## 2015-10-20 ENCOUNTER — Other Ambulatory Visit (INDEPENDENT_AMBULATORY_CARE_PROVIDER_SITE_OTHER): Payer: 59

## 2015-10-20 DIAGNOSIS — E291 Testicular hypofunction: Secondary | ICD-10-CM | POA: Diagnosis not present

## 2015-10-23 ENCOUNTER — Other Ambulatory Visit: Payer: Self-pay | Admitting: Medical

## 2015-11-03 ENCOUNTER — Other Ambulatory Visit (INDEPENDENT_AMBULATORY_CARE_PROVIDER_SITE_OTHER): Payer: 59

## 2015-11-03 DIAGNOSIS — E291 Testicular hypofunction: Secondary | ICD-10-CM

## 2015-11-03 DIAGNOSIS — R7989 Other specified abnormal findings of blood chemistry: Secondary | ICD-10-CM

## 2015-11-03 MED ORDER — TESTOSTERONE CYPIONATE 200 MG/ML IM SOLN
200.0000 mg | Freq: Once | INTRAMUSCULAR | Status: AC
  Administered 2015-11-03: 200 mg via INTRAMUSCULAR

## 2015-11-17 ENCOUNTER — Other Ambulatory Visit (INDEPENDENT_AMBULATORY_CARE_PROVIDER_SITE_OTHER): Payer: 59

## 2015-11-17 DIAGNOSIS — E291 Testicular hypofunction: Secondary | ICD-10-CM

## 2015-11-17 DIAGNOSIS — R7989 Other specified abnormal findings of blood chemistry: Secondary | ICD-10-CM

## 2015-11-17 MED ORDER — TESTOSTERONE CYPIONATE 200 MG/ML IM SOLN
200.0000 mg | Freq: Once | INTRAMUSCULAR | Status: AC
  Administered 2015-11-17: 200 mg via INTRAMUSCULAR

## 2015-11-17 NOTE — Progress Notes (Unsigned)
Pt was advised that he needs to be seen for a follow-up on his testosterone but shane tysinger, PA said we can draw his labs today for testosterone to see what his levels are

## 2015-11-18 LAB — TESTOSTERONE: TESTOSTERONE: 165 ng/dL — AB (ref 300–890)

## 2015-11-29 ENCOUNTER — Ambulatory Visit (INDEPENDENT_AMBULATORY_CARE_PROVIDER_SITE_OTHER): Payer: 59 | Admitting: Medical

## 2015-11-29 ENCOUNTER — Encounter: Payer: Self-pay | Admitting: Medical

## 2015-11-29 VITALS — BP 160/104 | HR 72 | Wt 237.0 lb

## 2015-11-29 DIAGNOSIS — E291 Testicular hypofunction: Secondary | ICD-10-CM | POA: Diagnosis not present

## 2015-11-29 DIAGNOSIS — R7301 Impaired fasting glucose: Secondary | ICD-10-CM

## 2015-11-29 DIAGNOSIS — E782 Mixed hyperlipidemia: Secondary | ICD-10-CM | POA: Diagnosis not present

## 2015-11-29 DIAGNOSIS — R030 Elevated blood-pressure reading, without diagnosis of hypertension: Secondary | ICD-10-CM

## 2015-11-29 DIAGNOSIS — Z79899 Other long term (current) drug therapy: Secondary | ICD-10-CM | POA: Diagnosis not present

## 2015-11-29 DIAGNOSIS — E669 Obesity, unspecified: Secondary | ICD-10-CM

## 2015-11-29 LAB — LIPID PANEL
Cholesterol: 143 mg/dL (ref 125–200)
HDL: 38 mg/dL — AB (ref >=40)
LDL CALC: 78 mg/dL (ref <130)
Total CHOL/HDL Ratio: 3.8 Ratio (ref <=5.0)
Triglycerides: 137 mg/dL (ref <150)
VLDL: 27 mg/dL (ref <30)

## 2015-11-29 LAB — ALT: ALT: 22 U/L (ref 9–46)

## 2015-11-29 MED ORDER — TESTOSTERONE 50 MG/5GM (1%) TD GEL: 5.0000 g | Freq: Every day | TRANSDERMAL | Status: DC

## 2015-11-29 NOTE — Patient Instructions (Signed)
DASH Eating Plan DASH stands for "Dietary Approaches to Stop Hypertension." The DASH eating plan is a healthy eating plan that has been shown to reduce high blood pressure (hypertension). Additional health benefits may include reducing the risk of type 2 diabetes mellitus, heart disease, and stroke. The DASH eating plan may also help with weight loss. WHAT DO I NEED TO KNOW ABOUT THE DASH EATING PLAN? For the DASH eating plan, you will follow these general guidelines:  Choose foods with a percent daily value for sodium of less than 5% (as listed on the food label).  Use salt-free seasonings or herbs instead of table salt or sea salt.  Check with your health care provider or pharmacist before using salt substitutes.  Eat lower-sodium products, often labeled as "lower sodium" or "no salt added."  Eat fresh foods.  Eat more vegetables, fruits, and low-fat dairy products.  Choose whole grains. Look for the word "whole" as the first word in the ingredient list.  Choose fish and skinless chicken or turkey more often than red meat. Limit fish, poultry, and meat to 6 oz (170 g) each day.  Limit sweets, desserts, sugars, and sugary drinks.  Choose heart-healthy fats.  Limit cheese to 1 oz (28 g) per day.  Eat more home-cooked food and less restaurant, buffet, and fast food.  Limit fried foods.  Cook foods using methods other than frying.  Limit canned vegetables. If you do use them, rinse them well to decrease the sodium.  When eating at a restaurant, ask that your food be prepared with less salt, or no salt if possible. WHAT FOODS CAN I EAT? Seek help from a dietitian for individual calorie needs. Grains Whole grain or whole wheat bread. Brown rice. Whole grain or whole wheat pasta. Quinoa, bulgur, and whole grain cereals. Low-sodium cereals. Corn or whole wheat flour tortillas. Whole grain cornbread. Whole grain crackers. Low-sodium crackers. Vegetables Fresh or frozen vegetables  (raw, steamed, roasted, or grilled). Low-sodium or reduced-sodium tomato and vegetable juices. Low-sodium or reduced-sodium tomato sauce and paste. Low-sodium or reduced-sodium canned vegetables.  Fruits All fresh, canned (in natural juice), or frozen fruits. Meat and Other Protein Products Ground beef (85% or leaner), grass-fed beef, or beef trimmed of fat. Skinless chicken or turkey. Ground chicken or turkey. Pork trimmed of fat. All fish and seafood. Eggs. Dried beans, peas, or lentils. Unsalted nuts and seeds. Unsalted canned beans. Dairy Low-fat dairy products, such as skim or 1% milk, 2% or reduced-fat cheeses, low-fat ricotta or cottage cheese, or plain low-fat yogurt. Low-sodium or reduced-sodium cheeses. Fats and Oils Tub margarines without trans fats. Light or reduced-fat mayonnaise and salad dressings (reduced sodium). Avocado. Safflower, olive, or canola oils. Natural peanut or almond butter. Other Unsalted popcorn and pretzels. The items listed above may not be a complete list of recommended foods or beverages. Contact your dietitian for more options. WHAT FOODS ARE NOT RECOMMENDED? Grains White bread. White pasta. White rice. Refined cornbread. Bagels and croissants. Crackers that contain trans fat. Vegetables Creamed or fried vegetables. Vegetables in a cheese sauce. Regular canned vegetables. Regular canned tomato sauce and paste. Regular tomato and vegetable juices. Fruits Dried fruits. Canned fruit in light or heavy syrup. Fruit juice. Meat and Other Protein Products Fatty cuts of meat. Ribs, chicken wings, bacon, sausage, bologna, salami, chitterlings, fatback, hot dogs, bratwurst, and packaged luncheon meats. Salted nuts and seeds. Canned beans with salt. Dairy Whole or 2% milk, cream, half-and-half, and cream cheese. Whole-fat or sweetened yogurt. Full-fat   cheeses or blue cheese. Nondairy creamers and whipped toppings. Processed cheese, cheese spreads, or cheese  curds. Condiments Onion and garlic salt, seasoned salt, table salt, and sea salt. Canned and packaged gravies. Worcestershire sauce. Tartar sauce. Barbecue sauce. Teriyaki sauce. Soy sauce, including reduced sodium. Steak sauce. Fish sauce. Oyster sauce. Cocktail sauce. Horseradish. Ketchup and mustard. Meat flavorings and tenderizers. Bouillon cubes. Hot sauce. Tabasco sauce. Marinades. Taco seasonings. Relishes. Fats and Oils Butter, stick margarine, lard, shortening, ghee, and bacon fat. Coconut, palm kernel, or palm oils. Regular salad dressings. Other Pickles and olives. Salted popcorn and pretzels. The items listed above may not be a complete list of foods and beverages to avoid. Contact your dietitian for more information. WHERE CAN I FIND MORE INFORMATION? National Heart, Lung, and Blood Institute: travelstabloid.com Document Released: 11/07/2011 Document Revised: 04/04/2014 Document Reviewed: 09/22/2013 [facility] Patient Information 2015 [place], [place]. This information is not intended to replace advice given to you by your health care provider. Make sure you discuss any questions you have with your health care provider.        Why follow it? Research shows. . Those who follow the Mediterranean diet have a reduced risk of heart disease  . The diet is associated with a reduced incidence of Parkinson's and Alzheimer's diseases . People following the diet may have longer life expectancies and lower rates of chronic diseases  . The Dietary Guidelines for Americans recommends the Mediterranean diet as an eating plan to promote health and prevent disease  What Is the Mediterranean Diet?  . Healthy eating plan based on typical foods and recipes of Mediterranean-style cooking . The diet is primarily a plant based diet; these foods should make up a majority of meals   Starches - Plant based foods should make up a majority of meals - They are an important  sources of vitamins, minerals, energy, antioxidants, and fiber - Choose whole grains, foods high in fiber and minimally processed items  - Typical grain sources include wheat, oats, barley, corn, brown rice, bulgar, farro, millet, polenta, couscous  - Various types of beans include chickpeas, lentils, fava beans, black beans, white beans   Fruits  Veggies - Large quantities of antioxidant rich fruits & veggies; 6 or more servings  - Vegetables can be eaten raw or lightly drizzled with oil and cooked  - Vegetables common to the traditional Mediterranean Diet include: artichokes, arugula, beets, broccoli, brussel sprouts, cabbage, carrots, celery, collard greens, cucumbers, eggplant, kale, leeks, lemons, lettuce, mushrooms, okra, onions, peas, peppers, potatoes, pumpkin, radishes, rutabaga, shallots, spinach, sweet potatoes, turnips, zucchini - Fruits common to the Mediterranean Diet include: apples, apricots, avocados, cherries, clementines, dates, figs, grapefruits, grapes, melons, nectarines, oranges, peaches, pears, pomegranates, strawberries, tangerines  Fats - Replace butter and margarine with healthy oils, such as olive oil, canola oil, and tahini  - Limit nuts to no more than a handful a day  - Nuts include walnuts, almonds, pecans, pistachios, pine nuts  - Limit or avoid candied, honey roasted or heavily salted nuts - Olives are central to the [employer] - can be eaten whole or used in a variety of dishes   Meats Protein - Limiting red meat: no more than a few times a month - When eating red meat: choose lean cuts and keep the portion to the size of deck of cards - Eggs: approx. 0 to 4 times a week  - Fish and lean poultry: at least 2 a week  - Healthy protein sources include,  chicken, [place], lean beef, lamb - Increase intake of seafood such as tuna, salmon, trout, mackerel, shrimp, scallops - Avoid or limit high fat processed meats such as sausage and bacon  Dairy - Include  moderate amounts of low fat dairy products  - Focus on healthy dairy such as fat free yogurt, skim milk, low or reduced fat cheese - Limit dairy products higher in fat such as whole or 2% milk, cheese, ice cream  Alcohol - Moderate amounts of red wine is ok  - No more than 5 oz daily for women (all ages) and men older than age 51  - No more than 10 oz of wine daily for men younger than 70  Other - Limit sweets and other desserts  - Use herbs and spices instead of salt to flavor foods  - Herbs and spices common to the traditional Mediterranean Diet include: basil, bay leaves, chives, cloves, cumin, fennel, garlic, lavender, marjoram, mint, oregano, parsley, pepper, rosemary, sage, savory, sumac, tarragon, thyme   It's not just a diet, it's a lifestyle:  . The Mediterranean diet includes lifestyle factors typical of those in the region  . Foods, drinks and meals are best eaten with others and savored . Daily physical activity is important for overall good health . This could be strenuous exercise like running and aerobics . This could also be more leisurely activities such as walking, housework, yard-work, or taking the stairs . Moderation is the key; a balanced and healthy diet accommodates most foods and drinks . Consider portion sizes and frequency of consumption of certain foods   Meal Ideas & Options:  . Breakfast:  o Whole wheat toast or whole wheat English muffins with peanut butter & hard boiled egg o Steel cut oats topped with apples & cinnamon and skim milk  o Fresh fruit: banana, strawberries, melon, berries, peaches  o Smoothies: strawberries, bananas, greek yogurt, peanut butter o Low fat greek yogurt with blueberries and granola  o Egg white omelet with spinach and mushrooms o Breakfast couscous: whole wheat couscous, apricots, skim milk, cranberries  . Sandwiches:  o Hummus and grilled vegetables (peppers, zucchini, squash) on whole wheat bread   o Grilled chicken on whole  wheat pita with lettuce, tomatoes, cucumbers or tzatziki  o Tuna salad on whole wheat bread: tuna salad made with greek yogurt, olives, red peppers, capers, green onions o Garlic rosemary lamb pita: lamb sauted with garlic, rosemary, salt & pepper; add lettuce, cucumber, greek yogurt to pita - flavor with lemon juice and black pepper  . Seafood:  o Mediterranean grilled salmon, seasoned with garlic, basil, parsley, lemon juice and black pepper o Shrimp, lemon, and spinach whole-grain pasta salad made with low fat greek yogurt  o Seared scallops with lemon orzo  o Seared tuna steaks seasoned salt, pepper, coriander topped with tomato mixture of olives, tomatoes, olive oil, minced garlic, parsley, green onions and cappers  . Meats:  o Herbed greek chicken salad with kalamata olives, cucumber, feta  o Red bell peppers stuffed with spinach, bulgur, lean ground beef (or lentils) & topped with feta   o Kebabs: skewers of chicken, tomatoes, onions, zucchini, squash  o [place] burgers: made with red onions, mint, dill, lemon juice, feta cheese topped with roasted red peppers . Vegetarian o Cucumber salad: cucumbers, artichoke hearts, celery, red onion, feta cheese, tossed in olive oil & lemon juice  o Hummus and whole grain pita points with a greek salad (lettuce, tomato, feta, olives, cucumbers, red  onion) o Lentil soup with celery, carrots made with vegetable broth, garlic, salt and pepper  o Tabouli salad: parsley, bulgur, mint, scallions, cucumbers, tomato, radishes, lemon juice, olive oil, salt and pepper.

## 2015-11-29 NOTE — Progress Notes (Signed)
Subjective: Chief Complaint  Patient presents with  . discuss labs    no questions at this time   Here for f/u on medications.   Compliant with lovaza and Crestor without c/o.   Fasting today.    Been using TST injections q2wk.  Feels improvement in energy on the medication.   Overall been doing fine.   No particular c/o.  Past Medical History  Diagnosis Date  . Mixed dyslipidemia 2005    hereditary  . Wears glasses   . Allergy     seasonal   . Back pain     intermittent pain, sees Dr. Limmie Patricia, Chiropractic   ROS as in subjective   Objective: BP 160/104 mmHg  Pulse 72  Wt 237 lb (107.502 kg)  General appearance: alert, no distress, WD/WN Neck: supple, no lymphadenopathy, no thyromegaly, no masses Heart: RRR, normal S1, S2, no murmurs Lungs: CTA bilaterally, no wheezes, rhonchi, or rales Abdomen: +bs, soft, non tender, non distended, no masses, no hepatomegaly, no splenomegaly Pulses: 2+ symmetric, upper and lower extremities, normal cap refill Ext: no edema   Assessment Encounter Diagnoses  Name Primary?  . Hypogonadism in male Yes  . Obesity   . Elevated blood-pressure reading without diagnosis of hypertension   . Mixed dyslipidemia   . High risk medication use   . Impaired fasting blood sugar     Plan: hypogonadism - reviewed recent labs.   No improvements with TST injections.  Change to Testim topical gel.  Advised of risks/benefits, proper use.  F/u in 1-2 mo Obesity - advised lifestyle changes, weight loss Elevated BPs - his home readings show about 50% normal and 50% mildly elevated readings .  C/t to monitor, advised Mediterranean diet, advised weight loss efforts.    Recheck in a few months.  Mixed dyslipidemia - labs today.  Lovaza was added last visit.   impaired glucose - advised lifestyle changes, labs today

## 2015-11-30 LAB — HEMOGLOBIN A1C
HEMOGLOBIN A1C: 6 % — AB (ref <5.7)
MEAN PLASMA GLUCOSE: 126 mg/dL — AB (ref <117)

## 2015-12-06 ENCOUNTER — Other Ambulatory Visit: Payer: Self-pay | Admitting: Medical

## 2015-12-28 ENCOUNTER — Encounter: Payer: Self-pay | Admitting: Medical

## 2016-01-03 ENCOUNTER — Other Ambulatory Visit: Payer: Self-pay | Admitting: Medical

## 2016-01-04 ENCOUNTER — Other Ambulatory Visit: Payer: Self-pay | Admitting: Medical

## 2016-01-04 ENCOUNTER — Telehealth: Payer: Self-pay | Admitting: Medical

## 2016-01-04 MED ORDER — TESTOSTERONE 10 MG/ACT (2%) TD GEL: 4.0000 | Freq: Every day | TRANSDERMAL | Status: DC

## 2016-01-04 NOTE — Telephone Encounter (Signed)
2 60g tubes last 30 days. Phoned in. Left detailed msg on pts personal voicemail

## 2016-01-04 NOTE — Telephone Encounter (Signed)
Due to cost, call out Fortesta testosterone Gel to pharmacy.   The label should say 4 pumps/squirts  (2 each thigh) daily, quantity sufficient for 19mo + 2 refills  Also, call patient.  Although label says 4 pumps, start with 1 pump on each upper thigh daily.   Recheck lab in 4-6 wk, nurse visit.

## 2016-01-09 ENCOUNTER — Telehealth: Payer: Self-pay | Admitting: Medical

## 2016-01-09 NOTE — Telephone Encounter (Signed)
No prior authorizations are required.  I called the pharmacy & searched for discount cards.  Prices are as followed Androgel (generic) $142.94, no discount card available  [place] (generic) $142.94, no discount card available Androgel 1.62% brand I activated discount card, cost for 2 pumps a day is $60.06 (must be the 1.62% for card to work)  Do you want to switch to this and is 2 pumps a day ok?

## 2016-01-09 NOTE — Telephone Encounter (Signed)
Please see telephone messages.   Do you have any prior auth forms for testosterone.  He is running into problems getting anything covered that is affordable.

## 2016-01-09 NOTE — Telephone Encounter (Signed)
Let him know and see what he wants to do?  FYI - was Axiron covered?

## 2016-01-11 NOTE — Telephone Encounter (Signed)
Left message for pt

## 2016-01-12 ENCOUNTER — Telehealth: Payer: Self-pay | Admitting: Medical

## 2016-01-12 MED ORDER — TESTOSTERONE 20.25 MG/ACT (1.62%) TD GEL: 2.0000 "application " | Freq: Every day | TRANSDERMAL | Status: DC

## 2016-01-12 NOTE — Telephone Encounter (Signed)
Pt wants to go with the Androgel 1.62% per last telephone call.  This was called into the pharmacy

## 2016-03-07 ENCOUNTER — Other Ambulatory Visit: Payer: Self-pay | Admitting: Medical

## 2016-03-25 ENCOUNTER — Encounter: Payer: Self-pay | Admitting: Medical

## 2016-03-25 ENCOUNTER — Other Ambulatory Visit: Payer: 59

## 2016-03-25 DIAGNOSIS — R7989 Other specified abnormal findings of blood chemistry: Secondary | ICD-10-CM

## 2016-03-25 NOTE — Telephone Encounter (Signed)
Put in orders for pts testosterone to be checked

## 2016-03-26 ENCOUNTER — Encounter: Payer: Self-pay | Admitting: Medical

## 2016-03-26 LAB — TESTOSTERONE: TESTOSTERONE: 158 ng/dL — AB (ref 250–827)

## 2016-04-25 ENCOUNTER — Encounter: Payer: Self-pay | Admitting: Medical

## 2016-04-26 MED ORDER — OMEGA-3-ACID ETHYL ESTERS 1 G PO CAPS: 2.0000 | ORAL_CAPSULE | Freq: Two times a day (BID) | ORAL | Status: DC

## 2016-04-26 NOTE — Telephone Encounter (Signed)
Refilled pts medication to last until he can come in since he is working out of town a lot right now

## 2016-07-03 ENCOUNTER — Telehealth: Payer: Self-pay | Admitting: Medical

## 2016-07-03 NOTE — Telephone Encounter (Signed)
Lets get him in for f/u, fasting.  Is there room to work him in for appt tomorrow?

## 2016-07-03 NOTE — Telephone Encounter (Signed)
Pt said it is time for him to come in for cholesterol labs & possibly testosterone. Pt would like to come in  In the morning for these labs. Is this ok? Call pt to let him know if approved so he knows if he can come in the morning.

## 2016-07-04 ENCOUNTER — Other Ambulatory Visit: Payer: 59

## 2016-07-04 ENCOUNTER — Other Ambulatory Visit: Payer: Self-pay | Admitting: Medical

## 2016-07-04 DIAGNOSIS — R7989 Other specified abnormal findings of blood chemistry: Secondary | ICD-10-CM

## 2016-07-04 DIAGNOSIS — E669 Obesity, unspecified: Secondary | ICD-10-CM

## 2016-07-04 DIAGNOSIS — R7301 Impaired fasting glucose: Secondary | ICD-10-CM

## 2016-07-04 DIAGNOSIS — E782 Mixed hyperlipidemia: Secondary | ICD-10-CM

## 2016-07-04 DIAGNOSIS — E291 Testicular hypofunction: Secondary | ICD-10-CM

## 2016-07-04 DIAGNOSIS — Z8249 Family history of ischemic heart disease and other diseases of the circulatory system: Secondary | ICD-10-CM

## 2016-07-04 DIAGNOSIS — I10 Essential (primary) hypertension: Secondary | ICD-10-CM

## 2016-07-04 DIAGNOSIS — Z0000 Encounter for general adult medical examination without abnormal findings: Secondary | ICD-10-CM | POA: Insufficient documentation

## 2016-07-04 LAB — CBC WITH DIFFERENTIAL/PLATELET
BASOS ABS: 0 {cells}/uL (ref 0–200)
BASOS PCT: 0 %
EOS ABS: 140 {cells}/uL (ref 15–500)
Eosinophils Relative: 2 %
HEMATOCRIT: 48.4 % (ref 38.5–50.0)
Hemoglobin: 16.2 g/dL (ref 13.2–17.1)
LYMPHS PCT: 39 %
Lymphs Abs: 2730 cells/uL (ref 850–3900)
MCH: 29 pg (ref 27.0–33.0)
MCHC: 33.5 g/dL (ref 32.0–36.0)
MCV: 86.6 fL (ref 80.0–100.0)
MONO ABS: 420 {cells}/uL (ref 200–950)
MONOS PCT: 6 %
MPV: 9.9 fL (ref 7.5–12.5)
NEUTROS PCT: 53 %
Neutro Abs: 3710 cells/uL (ref 1500–7800)
Platelets: 205 10*3/uL (ref 140–400)
RBC: 5.59 MIL/uL (ref 4.20–5.80)
RDW: 14 % (ref 11.0–15.0)
WBC: 7 10*3/uL (ref 4.0–10.5)

## 2016-07-04 LAB — LIPID PANEL
CHOL/HDL RATIO: 5.6 ratio — AB (ref <=5.0)
Cholesterol: 189 mg/dL (ref 125–200)
HDL: 34 mg/dL — AB (ref >=40)
TRIGLYCERIDES: 403 mg/dL — AB (ref <150)

## 2016-07-04 LAB — COMPREHENSIVE METABOLIC PANEL
ALBUMIN: 4.7 g/dL (ref 3.6–5.1)
ALT: 28 U/L (ref 9–46)
AST: 35 U/L (ref 10–35)
Alkaline Phosphatase: 59 U/L (ref 40–115)
BILIRUBIN TOTAL: 0.8 mg/dL (ref 0.2–1.2)
BUN: 18 mg/dL (ref 7–25)
CO2: 28 mmol/L (ref 20–31)
CREATININE: 1.04 mg/dL (ref 0.70–1.33)
Calcium: 10 mg/dL (ref 8.6–10.3)
Chloride: 100 mmol/L (ref 98–110)
Glucose, Bld: 111 mg/dL — ABNORMAL HIGH (ref 65–99)
Potassium: 5.2 mmol/L (ref 3.5–5.3)
SODIUM: 138 mmol/L (ref 135–146)
TOTAL PROTEIN: 7.6 g/dL (ref 6.1–8.1)

## 2016-07-04 NOTE — Telephone Encounter (Signed)
Called pt to let him know that Steven Fletcher will need to see him however pt was already pulling in our parking lot and would like to get labs now and come back for appt next week since there are not any open appts this morning. Steven Fletcher will enter lab orders

## 2016-07-05 LAB — PSA: PSA: 2.41 ng/mL

## 2016-07-05 LAB — TESTOSTERONE: Testosterone: 263 ng/dL (ref 250–827)

## 2016-07-05 LAB — HEMOGLOBIN A1C
Hgb A1c MFr Bld: 5.9 % — ABNORMAL HIGH
Mean Plasma Glucose: 123 mg/dL

## 2016-07-08 ENCOUNTER — Ambulatory Visit (INDEPENDENT_AMBULATORY_CARE_PROVIDER_SITE_OTHER): Payer: 59 | Admitting: Medical

## 2016-07-08 ENCOUNTER — Encounter: Payer: Self-pay | Admitting: Medical

## 2016-07-08 ENCOUNTER — Telehealth: Payer: Self-pay | Admitting: Medical

## 2016-07-08 VITALS — BP 140/80 | HR 71 | Wt 246.6 lb

## 2016-07-08 DIAGNOSIS — E291 Testicular hypofunction: Secondary | ICD-10-CM

## 2016-07-08 DIAGNOSIS — M6740 Ganglion, unspecified site: Secondary | ICD-10-CM | POA: Diagnosis not present

## 2016-07-08 DIAGNOSIS — R7989 Other specified abnormal findings of blood chemistry: Secondary | ICD-10-CM

## 2016-07-08 DIAGNOSIS — E669 Obesity, unspecified: Secondary | ICD-10-CM | POA: Diagnosis not present

## 2016-07-08 DIAGNOSIS — Z8249 Family history of ischemic heart disease and other diseases of the circulatory system: Secondary | ICD-10-CM

## 2016-07-08 DIAGNOSIS — R7301 Impaired fasting glucose: Secondary | ICD-10-CM | POA: Diagnosis not present

## 2016-07-08 DIAGNOSIS — E782 Mixed hyperlipidemia: Secondary | ICD-10-CM

## 2016-07-08 DIAGNOSIS — I10 Essential (primary) hypertension: Secondary | ICD-10-CM

## 2016-07-08 NOTE — Telephone Encounter (Signed)
Refer to Dr. Amedeo Plenty, [facility] for ganglion cyst of thumb

## 2016-07-08 NOTE — Progress Notes (Signed)
Subjective: Chief Complaint  Patient presents with  . Discuss Labs    no concerns.    Here for f/u.  He came in recently for fasting labs. Here to review these.  He has hx/o hypertension, hyperlipidemia/mixed dyslipidemia, low testosterone, hypogonadism, obesity.   He notes recently with work traveled to [place], [place], tried many different foods, so diet hasn't been the best and he has been exercising less.  He is compliant with Lovaza and Crestor.  Takes Aspirin 81mg  QHS.    He does not wish to restart testosterone currently. It was too expensive when he was taking it months ago and it didn't help much.  Denies lots of fatigue currently, no low libido currently.   Lives alone.    Has chronic issue with ganglion cyst of right thumb.  In recent months it got much bigger, then came back down to current size.  It bothers him and affects thumb motion.  Wants it cut out.   Saw Dr. Amedeo Plenty prior in the past with ortho issue, wants to see him again for this/  Past Medical History:  Diagnosis Date  . Allergy    seasonal   . Back pain    intermittent pain, sees Dr. Limmie Patricia, Chiropractic  . Mixed dyslipidemia 2005   hereditary  . Wears glasses    Current Outpatient Prescriptions on File Prior to Visit  Medication Sig Dispense Refill  . aspirin EC 81 MG tablet Take 1 tablet (81 mg total) by mouth daily. 90 tablet 3  . omega-3 acid ethyl esters (LOVAZA) 1 g capsule Take 2 capsules (2 g total) by mouth 2 (two) times daily. 120 capsule 1  . rosuvastatin (CRESTOR) 20 MG tablet Take 1 tablet (20 mg total) by mouth daily. 90 tablet 3  . Testosterone (ANDROGEL PUMP) 20.25 MG/ACT (1.62%) GEL Place 2 application onto the skin daily. (Patient not taking: Reported on 07/08/2016) 75 g 3  . testosterone cypionate (DEPOTESTOSTERONE CYPIONATE) 200 MG/ML injection Inject 1 mL (200 mg total) into the muscle every 14 (fourteen) days. (Patient not taking: Reported on 11/29/2015) 10 mL 0   No current  facility-administered medications on file prior to visit.    ROS as in subjective  Patient's cardiac risk factors are: advanced age (older than 61 for men, 21 for women), dyslipidemia, family history of premature cardiovascular disease, male gender, obesity (BMI >= 30 kg/m2) and prediabetic, low HDL.     Objective: BP 140/80   Pulse 71   Wt 246 lb 9.6 oz (111.9 kg)   SpO2 98%   BMI 33.44 kg/m   Wt Readings from Last 3 Encounters:  07/08/16 246 lb 9.6 oz (111.9 kg)  11/29/15 237 lb (107.5 kg)  06/07/15 237 lb (107.5 kg)   BP Readings from Last 3 Encounters:  07/08/16 140/80  11/29/15 (!) 160/104  06/07/15 (!) 150/100    General appearance: alert, no distress, WD/WN Right thumb PIP lateral surface of distal phalanx with slightly mobile tender roundish lump suggestive of ganglion cyst.  Normal thumb ROM.  Otherwise hands nontender, normal finger ROM.   Neck: supple, no lymphadenopathy, no thyromegaly, no masses, no bruits Heart: RRR, normal S1, S2, no murmurs Lungs: CTA bilaterally, no wheezes, rhonchi, or rales Pulses: 2+ symmetric, upper and lower extremities, normal cap refill Ext: no edema    Assessment: Encounter Diagnoses  Name Primary?  . Essential hypertension Yes  . Impaired fasting blood sugar   . Hypogonadism in male   . Obesity   .  Mixed dyslipidemia   . Low testosterone   . Family history of premature CAD   . Ganglion cyst     Plan: Discussed his recent labs.  HTN - advised medication. He declines. discussed risks of uncontrolled hypertension. He wants to work on lifestyle changes, weight loss, salt avoidence.  F/ u no later than 49mo Impaired glucose - lifestyle changes advised Hypogonadism, low TST - declines therapy for now obesity - advised lifestyle changes, weight loss Mixed dyslipidemia - c/t lovaza and Crestor but needs to do much better with diet.  The recent bump in elevated TRIG seems to be diet related given prior findings on labs and the  fact he is actually taking Lovaza, but TRIG the worst they have been Ganglion cyst - referral to Dr. Amedeo Plenty

## 2016-07-09 ENCOUNTER — Other Ambulatory Visit: Payer: Self-pay

## 2016-07-09 ENCOUNTER — Encounter: Payer: Self-pay | Admitting: Medical

## 2016-07-09 MED ORDER — OMEGA-3-ACID ETHYL ESTERS 1 G PO CAPS
2.0000 | ORAL_CAPSULE | Freq: Two times a day (BID) | ORAL | 3 refills | Status: DC
Start: 2016-07-09 — End: 2016-12-04

## 2016-07-09 MED ORDER — ROSUVASTATIN CALCIUM 20 MG PO TABS: 20.0000 mg | ORAL_TABLET | Freq: Every day | ORAL | 3 refills | Status: DC

## 2016-07-15 NOTE — Telephone Encounter (Signed)
Has already been done.  

## 2016-11-17 ENCOUNTER — Encounter: Payer: Self-pay | Admitting: Medical

## 2016-11-18 ENCOUNTER — Telehealth: Payer: Self-pay | Admitting: Medical

## 2016-11-18 ENCOUNTER — Other Ambulatory Visit: Payer: Self-pay | Admitting: Medical

## 2016-11-18 DIAGNOSIS — Z0000 Encounter for general adult medical examination without abnormal findings: Secondary | ICD-10-CM

## 2016-11-18 DIAGNOSIS — E782 Mixed hyperlipidemia: Secondary | ICD-10-CM

## 2016-11-18 DIAGNOSIS — R7989 Other specified abnormal findings of blood chemistry: Secondary | ICD-10-CM

## 2016-11-18 NOTE — Telephone Encounter (Signed)
I got his email.  Please call in whatever needs refills for 30 day supply, and verify dose # of pumps/squirts daily of testosterone.  Lets get him scheduled for physical and fasting labs in January.  If he wants to come in for fasting labs prior to visit, it needs to be within 7 days of the visit for insurance purposes.

## 2016-11-19 ENCOUNTER — Other Ambulatory Visit: Payer: Self-pay | Admitting: Medical

## 2016-11-19 ENCOUNTER — Other Ambulatory Visit: Payer: Self-pay

## 2016-11-19 MED ORDER — ROSUVASTATIN CALCIUM 20 MG PO TABS: 20.0000 mg | ORAL_TABLET | Freq: Every day | ORAL | 3 refills | Status: DC

## 2016-11-19 MED ORDER — ASPIRIN EC 81 MG PO TBEC: 81.0000 mg | DELAYED_RELEASE_TABLET | Freq: Every day | ORAL | 3 refills | Status: DC

## 2016-11-19 MED ORDER — TESTOSTERONE 20.25 MG/ACT (1.62%) TD GEL: 2.0000 "application " | Freq: Every day | TRANSDERMAL | 3 refills | Status: DC

## 2016-11-19 NOTE — Telephone Encounter (Signed)
Pt called back said that he needs a refill on his cholesterol meds,  He was not able to come in for an appt until jan.,made an appt in jan and lab work  On 12/04/16 . Pt didn't need a refill on testosterone. Call the  Pharmacy to cancel order.

## 2016-11-19 NOTE — Telephone Encounter (Signed)
Call in refills to pharmacy called and l/m for patient to call us back about coming for labs

## 2016-11-21 ENCOUNTER — Encounter: Payer: Self-pay | Admitting: Internal Medicine

## 2016-12-04 ENCOUNTER — Ambulatory Visit (INDEPENDENT_AMBULATORY_CARE_PROVIDER_SITE_OTHER): Payer: 59 | Admitting: Medical

## 2016-12-04 ENCOUNTER — Encounter: Payer: Self-pay | Admitting: Medical

## 2016-12-04 VITALS — BP 140/70 | HR 77 | Ht 74.0 in | Wt 239.2 lb

## 2016-12-04 DIAGNOSIS — E291 Testicular hypofunction: Secondary | ICD-10-CM | POA: Diagnosis not present

## 2016-12-04 DIAGNOSIS — M6208 Separation of muscle (nontraumatic), other site: Secondary | ICD-10-CM | POA: Diagnosis not present

## 2016-12-04 DIAGNOSIS — Z6830 Body mass index (BMI) 30.0-30.9, adult: Secondary | ICD-10-CM

## 2016-12-04 DIAGNOSIS — Z7189 Other specified counseling: Secondary | ICD-10-CM

## 2016-12-04 DIAGNOSIS — R7301 Impaired fasting glucose: Secondary | ICD-10-CM

## 2016-12-04 DIAGNOSIS — E782 Mixed hyperlipidemia: Secondary | ICD-10-CM

## 2016-12-04 DIAGNOSIS — M20002 Unspecified deformity of left finger(s): Secondary | ICD-10-CM | POA: Diagnosis not present

## 2016-12-04 DIAGNOSIS — Z0000 Encounter for general adult medical examination without abnormal findings: Secondary | ICD-10-CM | POA: Diagnosis not present

## 2016-12-04 DIAGNOSIS — I10 Essential (primary) hypertension: Secondary | ICD-10-CM | POA: Diagnosis not present

## 2016-12-04 DIAGNOSIS — E66811 Obesity, class 1: Secondary | ICD-10-CM

## 2016-12-04 DIAGNOSIS — E669 Obesity, unspecified: Secondary | ICD-10-CM

## 2016-12-04 DIAGNOSIS — Z1211 Encounter for screening for malignant neoplasm of colon: Secondary | ICD-10-CM | POA: Insufficient documentation

## 2016-12-04 DIAGNOSIS — Z8249 Family history of ischemic heart disease and other diseases of the circulatory system: Secondary | ICD-10-CM | POA: Diagnosis not present

## 2016-12-04 DIAGNOSIS — Z7185 Encounter for immunization safety counseling: Secondary | ICD-10-CM

## 2016-12-04 LAB — POCT URINALYSIS DIPSTICK
Bilirubin, UA: NEGATIVE
Blood, UA: NEGATIVE
Glucose, UA: NEGATIVE
Ketones, UA: NEGATIVE
LEUKOCYTES UA: NEGATIVE
NITRITE UA: NEGATIVE
Spec Grav, UA: 1.02
UROBILINOGEN UA: NEGATIVE
pH, UA: 6.5

## 2016-12-04 MED ORDER — ASPIRIN EC 81 MG PO TBEC: 81.0000 mg | DELAYED_RELEASE_TABLET | Freq: Every day | ORAL | 3 refills | Status: DC

## 2016-12-04 MED ORDER — QUINAPRIL HCL 10 MG PO TABS: 10.0000 mg | ORAL_TABLET | Freq: Every day | ORAL | 0 refills | Status: DC

## 2016-12-04 MED ORDER — OMEGA-3-ACID ETHYL ESTERS 1 G PO CAPS: 2.0000 | ORAL_CAPSULE | Freq: Two times a day (BID) | ORAL | 3 refills | Status: DC

## 2016-12-04 NOTE — Patient Instructions (Addendum)
Recommendations:  Begin Quinapril for blood pressure 10mg  daily  continue your current medications  Work on losing weight , eating a healthy low fat diet, and getting daily exercise  Your abdominal issue is called Diastasis recti.  This is not a true hernia.  Work on situps, abdomen crunches and back extensions to strengthen the core.  Do these exercises regularly  Call insurance to see if they will cover the Shingrix vaccine when it becomes available this spring  Recheck in 1 month for blood pressure check with nurse and lab for kidney marker (BMET)  Recheck in 3-4 months on cholesterol, fasting labs.       Hypertension Hypertension, commonly called high blood pressure, is when the force of blood pumping through your arteries is too strong. Your arteries are the blood vessels that carry blood from your heart throughout your body. A blood pressure reading consists of a higher number over a lower number, such as 110/72. The higher number (systolic) is the pressure inside your arteries when your heart pumps. The lower number (diastolic) is the pressure inside your arteries when your heart relaxes. Ideally you want your blood pressure below 120/80. Hypertension forces your heart to work harder to pump blood. Your arteries may become narrow or stiff. Having untreated or uncontrolled hypertension can cause heart attack, stroke, kidney disease, and other problems. What increases the risk? Some risk factors for high blood pressure are controllable. Others are not. Risk factors you cannot control include:  Race. You may be at higher risk if you are African American.  Age. Risk increases with age.  Gender. Men are at higher risk than women before age 25 years. After age 38, women are at higher risk than men. Risk factors you can control include:  Not getting enough exercise or physical activity.  Being overweight.  Getting too much fat, sugar, calories, or salt in your diet.  Drinking  too much alcohol. What are the signs or symptoms? Hypertension does not usually cause signs or symptoms. Extremely high blood pressure (hypertensive crisis) may cause headache, anxiety, shortness of breath, and nosebleed. How is this diagnosed? To check if you have hypertension, your health care provider will measure your blood pressure while you are seated, with your arm held at the level of your heart. It should be measured at least twice using the same arm. Certain conditions can cause a difference in blood pressure between your right and left arms. A blood pressure reading that is higher than normal on one occasion does not mean that you need treatment. If it is not clear whether you have high blood pressure, you may be asked to return on a different day to have your blood pressure checked again. Or, you may be asked to monitor your blood pressure at home for 1 or more weeks. How is this treated? Treating high blood pressure includes making lifestyle changes and possibly taking medicine. Living a healthy lifestyle can help lower high blood pressure. You may need to change some of your habits. Lifestyle changes may include:  Following the DASH diet. This diet is high in fruits, vegetables, and whole grains. It is low in salt, red meat, and added sugars.  Keep your sodium intake below 2,300 mg per day.  Getting at least 30-45 minutes of aerobic exercise at least 4 times per week.  Losing weight if necessary.  Not smoking.  Limiting alcoholic beverages.  Learning ways to reduce stress. Your health care provider may prescribe medicine if lifestyle changes  are not enough to get your blood pressure under control, and if one of the following is true:  You are 59-86 years of age and your systolic blood pressure is above 140.  You are 103 years of age or older, and your systolic blood pressure is above 150.  Your diastolic blood pressure is above 90.  You have diabetes, and your systolic  blood pressure is over [id number] or your diastolic blood pressure is over 90.  You have kidney disease and your blood pressure is above 140/90.  You have heart disease and your blood pressure is above 140/90. Your personal target blood pressure may vary depending on your medical conditions, your age, and other factors. Follow these instructions at home:  Have your blood pressure rechecked as directed by your health care provider.  Take medicines only as directed by your health care provider. Follow the directions carefully. Blood pressure medicines must be taken as prescribed. The medicine does not work as well when you skip doses. Skipping doses also puts you at risk for problems.  Do not smoke.  Monitor your blood pressure at home as directed by your health care provider. Contact a health care provider if:  You think you are having a reaction to medicines taken.  You have recurrent headaches or feel dizzy.  You have swelling in your ankles.  You have trouble with your vision. Get help right away if:  You develop a severe headache or confusion.  You have unusual weakness, numbness, or feel faint.  You have severe chest or abdominal pain.  You vomit repeatedly.  You have trouble breathing. This information is not intended to replace advice given to you by your health care provider. Make sure you discuss any questions you have with your health care provider. Document Released: 11/18/2005 Document Revised: 04/25/2016 Document Reviewed: 09/10/2013 Elsevier Interactive Patient Education  2017 [employer].

## 2016-12-04 NOTE — Progress Notes (Signed)
Subjective:   HPI  Steven Fletcher is a 56 y.o. male who presents for a complete physical.    Concerns: Has bulge in center of belly wonders about hernia.  non tender  He notes left middle finger had splinter weeks ago.  He ended up digging it out, but there is still a hump on his finger he wants looked at.    Reviewed their medical, surgical, family, social, medication, and allergy history and updated chart as appropriate.  Past Medical History:  Diagnosis Date  . Allergy    seasonal   . Back pain    intermittent pain, sees Dr. Limmie Patricia, Chiropractic  . Family history of premature CAD   . Hypertension   . Impaired fasting blood sugar   . Mixed dyslipidemia 2005   hereditary  . Obesity   . Wears glasses     Past Surgical History:  Procedure Laterality Date  . COLONOSCOPY  2010   [place]; normal, repeat 2020  . elbow Right    arthroscopic for cartilege 1980, 1984, 1989  . ELBOW SURGERY Right 1990   move nerve  . KNEE CARTILAGE SURGERY Bilateral 2003, 2005     x 2, R 2005, L 2003  . TONSILLECTOMY    . WISDOM TOOTH EXTRACTION    . wrist sugery Right 2000    bone spur    Social History   Social History  . Marital status: Legally Separated    Spouse name: N/A  . Number of children: N/A  . Years of education: N/A   Occupational History  . 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 Smoking status: Never Smoker  . Smokeless tobacco: Never Used  . Alcohol use 0.6 oz/week    1 Cans of beer per week     Comment: occasional  . Drug use: No  . Sexual activity: Not Currently   Other Topics Concern  . 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  Working [employer], Merchant navy officer.  24yo child.   No significant other.  As of 12/2016    Family History  Problem Relation Age of Onset  . Alzheimer's disease Mother   . Hyperlipidemia Mother   .  Hypertension Father   . Other Father     died of old age  . Cancer Sister     breast  . Hyperlipidemia Sister   . Heart disease Brother 75    died of MI  . Hyperlipidemia Brother   . Diabetes Maternal Grandmother   . Diabetes Paternal Grandmother   . Stroke Neg Hx      Current Outpatient Prescriptions:  .  aspirin EC 81 MG tablet, Take 1 tablet (81 mg total) by mouth daily., Disp: 90 tablet, Rfl: 3 .  omega-3 acid ethyl esters (LOVAZA) 1 g capsule, Take 2 capsules (2 g total) by mouth 2 (two) times daily., Disp: 120 capsule, Rfl: 3 .  rosuvastatin (CRESTOR) 20 MG tablet, Take 1 tablet (20 mg total) by mouth daily., Disp: 90 tablet, Rfl: 3 .  quinapril (ACCUPRIL) 10 MG tablet, Take 1 tablet (10 mg total) by mouth daily., Disp: 90 tablet, Rfl: 0  No Known Allergies  Review of Systems Constitutional: -fever, -chills, -sweats, -unexpected weight change, -decreased appetite, -fatigue Allergy: -sneezing, -itching, -congestion Dermatology: -changing moles, --rash, +lumps ENT: -runny nose, -ear pain, -sore throat, -hoarseness, -sinus  pain, -teeth pain, - ringing in ears, -hearing loss, -nosebleeds Cardiology: -chest pain, -palpitations, -swelling, -difficulty breathing when lying flat, -waking up short of breath Respiratory: -cough, -shortness of breath, -difficulty breathing with exercise or exertion, -wheezing, -coughing up blood Gastroenterology: -abdominal pain, -nausea, -vomiting, -diarrhea, -constipation, -blood in stool, -changes in bowel movement, -difficulty swallowing or eating Hematology: -bleeding, -bruising  Musculoskeletal: -joint aches, -muscle aches, -joint swelling, -back pain, -neck pain, -cramping, -changes in gait Ophthalmology: denies vision changes, eye redness, itching, discharge Urology: -burning with urination, -difficulty urinating, -blood in urine, -urinary frequency, -urgency, -incontinence Neurology: -headache, -weakness, -tingling, -numbness, -memory loss,  -falls, -dizziness Psychology: -depressed mood, -agitation, -sleep problems     Objective:   Physical Exam  BP 140/70   Pulse 77   Ht 6\' 2"  (1.88 m)   Wt 239 lb 3.2 oz (108.5 kg)   SpO2 95%   BMI 30.71 kg/m    General appearance: alert, no distress, WD/WN, white male Skin: scattered macules,left middle finger and lateral edge of fingernail base with raised pinkish area, but no obvious foreign body, fluctuance, or warmth HEENT: normocephalic, conjunctiva/corneas normal, sclerae anicteric, PERRLA, EOMi, nares patent, no discharge or erythema, pharynx normal Oral cavity: MMM, tongue normal, teeth normal Neck: supple, no lymphadenopathy, no thyromegaly, no masses, normal ROM, no bruits Chest: non tender, normal shape and expansion Heart: RRR, normal S1, S2, no murmurs Lungs: CTA bilaterally, no wheezes, rhonchi, or rales Abdomen: +bs, soft, bulge in upper abdomen c/w diastasis recti, non tender, non distended, no masses, no hepatomegaly, no splenomegaly, no bruits Back: non tender, normal ROM, no scoliosis Musculoskeletal: right thumb DIP with bony arthritic changes, right dorsal wrist laterally with small surgical scar, right medial and lateral elbow surgical scars,  anterior port surgical scars both knees, upper extremities non tender, no obvious deformity, normal ROM throughout, lower extremities non tender, no obvious deformity, normal ROM throughout Extremities: no edema, no cyanosis, no clubbing Pulses: 2+ symmetric, upper and lower extremities, normal cap refill Neurological: alert, oriented x 3, CN2-12 intact, strength normal upper extremities and lower extremities, sensation normal throughout, DTRs 2+ throughout, no cerebellar signs, gait normal Psychiatric: normal affect, behavior normal, pleasant  GU: normal male external genitalia, circumcised, nontender, no masses, no hernia, no lymphadenopathy Rectal: deferred   Assessment and Plan :    Encounter Diagnoses  Name  Primary?  . Routine general medical examination at a health care facility Yes  . Deviation of finger of left hand   . Diastasis recti   . Essential hypertension   . Impaired fasting blood sugar   . Hypogonadism in male   . Family history of premature CAD   . Mixed dyslipidemia   . Class 1 obesity with serious comorbidity and body mass index (BMI) of 30.0 to 30.9 in adult, unspecified obesity type   . Vaccine counseling     Physical exam - discussed healthy lifestyle, diet, exercise, preventative care, vaccinations, and addressed their concerns.    See eye doctor and dentist yearly.  Flu shot at work soon.  Counseled on the new Shingrix vaccine coming out.  He will check insurance for coverage  Work on some weight loss through healthy diet and exercise.  hypertension - discussed diagnosis, need to get BP under control.   Begin quinapril, discussed risks/benefits of medication.    C/t same medications, return in 35mo for nurse visit for BP check and BMET lab, 3-4 for fasting lipid recheck.   Recommendations:  Begin Quinapril for blood pressure  10mg  daily  continue your current medications  Work on losing weight , eating a healthy low fat diet, and getting daily exercise  Your abdominal issue is called Diastasis recti.  This is not a true hernia.  Work on situps, abdomen crunches and back extensions to strengthen the core.  Do these exercises regularly  Call insurance to see if they will cover the Shingrix vaccine when it becomes available this spring  Recheck in 1 month for blood pressure check with nurse and lab for kidney marker (BMET)  Recheck in 3-4 months on cholesterol, fasting labs.     Steven Fletcher was seen today for physical.  Diagnoses and all orders for this visit:  Routine general medical examination at a health care facility -     Urinalysis Dipstick  Deviation of finger of left hand  Diastasis recti  Essential hypertension  Impaired fasting blood  sugar  Hypogonadism in male  Family history of premature CAD  Mixed dyslipidemia  Class 1 obesity with serious comorbidity and body mass index (BMI) of 30.0 to 30.9 in adult, unspecified obesity type  Vaccine counseling  Other orders -     omega-3 acid ethyl esters (LOVAZA) 1 g capsule; Take 2 capsules (2 g total) by mouth 2 (two) times daily. -     aspirin EC 81 MG tablet; Take 1 tablet (81 mg total) by mouth daily. -     quinapril (ACCUPRIL) 10 MG tablet; Take 1 tablet (10 mg total) by mouth daily.

## 2017-01-23 ENCOUNTER — Encounter: Payer: Self-pay | Admitting: Medical

## 2017-01-27 ENCOUNTER — Other Ambulatory Visit: Payer: Self-pay | Admitting: Medical

## 2017-01-27 DIAGNOSIS — Z79899 Other long term (current) drug therapy: Secondary | ICD-10-CM

## 2017-01-27 DIAGNOSIS — I10 Essential (primary) hypertension: Secondary | ICD-10-CM

## 2017-02-03 ENCOUNTER — Ambulatory Visit (INDEPENDENT_AMBULATORY_CARE_PROVIDER_SITE_OTHER): Payer: 59 | Admitting: Medical

## 2017-02-03 ENCOUNTER — Encounter: Payer: Self-pay | Admitting: Medical

## 2017-02-03 VITALS — BP 150/100 | HR 72 | Wt 234.0 lb

## 2017-02-03 DIAGNOSIS — Z79899 Other long term (current) drug therapy: Secondary | ICD-10-CM

## 2017-02-03 DIAGNOSIS — E349 Endocrine disorder, unspecified: Secondary | ICD-10-CM | POA: Diagnosis not present

## 2017-02-03 DIAGNOSIS — I10 Essential (primary) hypertension: Secondary | ICD-10-CM | POA: Diagnosis not present

## 2017-02-03 DIAGNOSIS — R7989 Other specified abnormal findings of blood chemistry: Secondary | ICD-10-CM

## 2017-02-03 DIAGNOSIS — E782 Mixed hyperlipidemia: Secondary | ICD-10-CM | POA: Diagnosis not present

## 2017-02-03 DIAGNOSIS — Z0000 Encounter for general adult medical examination without abnormal findings: Secondary | ICD-10-CM

## 2017-02-03 LAB — BASIC METABOLIC PANEL
BUN: 20 mg/dL (ref 7–25)
CALCIUM: 9.7 mg/dL (ref 8.6–10.3)
CHLORIDE: 101 mmol/L (ref 98–110)
CO2: 25 mmol/L (ref 20–31)
Creat: 0.85 mg/dL (ref 0.70–1.33)
GLUCOSE: 96 mg/dL (ref 65–99)
POTASSIUM: 4.2 mmol/L (ref 3.5–5.3)
SODIUM: 137 mmol/L (ref 135–146)

## 2017-02-03 LAB — COMPREHENSIVE METABOLIC PANEL
ALT: 19 U/L (ref 9–46)
AST: 25 U/L (ref 10–35)
Albumin: 4.6 g/dL (ref 3.6–5.1)
Alkaline Phosphatase: 55 U/L (ref 40–115)
BUN: 20 mg/dL (ref 7–25)
CO2: 25 mmol/L (ref 20–31)
Calcium: 9.7 mg/dL (ref 8.6–10.3)
Chloride: 101 mmol/L (ref 98–110)
Creat: 0.85 mg/dL (ref 0.70–1.33)
Glucose, Bld: 96 mg/dL (ref 65–99)
Potassium: 4.2 mmol/L (ref 3.5–5.3)
Sodium: 137 mmol/L (ref 135–146)
Total Bilirubin: 0.8 mg/dL (ref 0.2–1.2)
Total Protein: 7.1 g/dL (ref 6.1–8.1)

## 2017-02-03 LAB — LIPID PANEL
CHOL/HDL RATIO: 5.3 ratio — AB (ref <5.0)
CHOLESTEROL: 175 mg/dL (ref <200)
HDL: 33 mg/dL — ABNORMAL LOW (ref >40)
LDL Cholesterol: 88 mg/dL (ref <100)
Triglycerides: 268 mg/dL — ABNORMAL HIGH (ref <150)
VLDL: 54 mg/dL — ABNORMAL HIGH (ref <30)

## 2017-02-03 MED ORDER — QUINAPRIL-HYDROCHLOROTHIAZIDE 20-12.5 MG PO TABS: 1.0000 | ORAL_TABLET | Freq: Every day | ORAL | 2 refills | Status: DC

## 2017-02-03 NOTE — Patient Instructions (Signed)
Change to Quinapril HCT to better control the blood pressure Continue checking with home cuff Goal is 130/80 or less Limit salt exercise, and eat healthy  We will call with lab results    Hypertension Hypertension, commonly called high blood pressure, is when the force of blood pumping through the arteries is too strong. The arteries are the blood vessels that carry blood from the heart throughout the body. Hypertension forces the heart to work harder to pump blood and may cause arteries to become narrow or stiff. Having untreated or uncontrolled hypertension can cause heart attacks, strokes, kidney disease, and other problems. A blood pressure reading consists of a higher number over a lower number. Ideally, your blood pressure should be below 120/80. The first ("top") number is called the systolic pressure. It is a measure of the pressure in your arteries as your heart beats. The second ("bottom") number is called the diastolic pressure. It is a measure of the pressure in your arteries as the heart relaxes. What are the causes? The cause of this condition is not known. What increases the risk? Some risk factors for high blood pressure are under your control. Others are not. Factors you can change   Smoking.  Having type 2 diabetes mellitus, high cholesterol, or both.  Not getting enough exercise or physical activity.  Being overweight.  Having too much fat, sugar, calories, or salt (sodium) in your diet.  Drinking too much alcohol. Factors that are difficult or impossible to change   Having chronic kidney disease.  Having a family history of high blood pressure.  Age. Risk increases with age.  Race. You may be at higher risk if you are African-American.  Gender. Men are at higher risk than women before age 53. After age 24, women are at higher risk than men.  Having obstructive sleep apnea.  Stress. What are the signs or symptoms? Extremely high blood pressure  (hypertensive crisis) may cause:  Headache.  Anxiety.  Shortness of breath.  Nosebleed.  Nausea and vomiting.  Severe chest pain.  Jerky movements you cannot control (seizures). How is this diagnosed? This condition is diagnosed by measuring your blood pressure while you are seated, with your arm resting on a surface. The cuff of the blood pressure monitor will be placed directly against the skin of your upper arm at the level of your heart. It should be measured at least twice using the same arm. Certain conditions can cause a difference in blood pressure between your right and left arms. Certain factors can cause blood pressure readings to be lower or higher than normal (elevated) for a short period of time:  When your blood pressure is higher when you are in a health care provider's office than when you are at home, this is called white coat hypertension. Most people with this condition do not need medicines.  When your blood pressure is higher at home than when you are in a health care provider's office, this is called masked hypertension. Most people with this condition may need medicines to control blood pressure. If you have a high blood pressure reading during one visit or you have normal blood pressure with other risk factors:  You may be asked to return on a different day to have your blood pressure checked again.  You may be asked to monitor your blood pressure at home for 1 week or longer. If you are diagnosed with hypertension, you may have other blood or imaging tests to help your health  care provider understand your overall risk for other conditions. How is this treated? This condition is treated by making healthy lifestyle changes, such as eating healthy foods, exercising more, and reducing your alcohol intake. Your health care provider may prescribe medicine if lifestyle changes are not enough to get your blood pressure under control, and if:  Your systolic blood  pressure is above 130.  Your diastolic blood pressure is above 80. Your personal target blood pressure may vary depending on your medical conditions, your age, and other factors. Follow these instructions at home: Eating and drinking   Eat a diet that is high in fiber and potassium, and low in sodium, added sugar, and fat. An example eating plan is called the DASH (Dietary Approaches to Stop Hypertension) diet. To eat this way:  Eat plenty of fresh fruits and vegetables. Try to fill half of your plate at each meal with fruits and vegetables.  Eat whole grains, such as whole wheat pasta, brown rice, or whole grain bread. Fill about one quarter of your plate with whole grains.  Eat or drink low-fat dairy products, such as skim milk or low-fat yogurt.  Avoid fatty cuts of meat, processed or cured meats, and poultry with skin. Fill about one quarter of your plate with lean proteins, such as fish, chicken without skin, beans, eggs, and tofu.  Avoid premade and processed foods. These tend to be higher in sodium, added sugar, and fat.  Reduce your daily sodium intake. Most people with hypertension should eat less than 1,500 mg of sodium a day.  Limit alcohol intake to no more than 1 drink a day for nonpregnant women and 2 drinks a day for men. One drink equals 12 oz of beer, 5 oz of wine, or 1 oz of hard liquor. Lifestyle   Work with your health care provider to maintain a healthy body weight or to lose weight. Ask what an ideal weight is for you.  Get at least 30 minutes of exercise that causes your heart to beat faster (aerobic exercise) most days of the week. Activities may include walking, swimming, or biking.  Include exercise to strengthen your muscles (resistance exercise), such as pilates or lifting weights, as part of your weekly exercise routine. Try to do these types of exercises for 30 minutes at least 3 days a week.  Do not use any products that contain nicotine or tobacco, such  as cigarettes and e-cigarettes. If you need help quitting, ask your health care provider.  Monitor your blood pressure at home as told by your health care provider.  Keep all follow-up visits as told by your health care provider. This is important. Medicines   Take over-the-counter and prescription medicines only as told by your health care provider. Follow directions carefully. Blood pressure medicines must be taken as prescribed.  Do not skip doses of blood pressure medicine. Doing this puts you at risk for problems and can make the medicine less effective.  Ask your health care provider about side effects or reactions to medicines that you should watch for. Contact a health care provider if:  You think you are having a reaction to a medicine you are taking.  You have headaches that keep coming back (recurring).  You feel dizzy.  You have swelling in your ankles.  You have trouble with your vision. Get help right away if:  You develop a severe headache or confusion.  You have unusual weakness or numbness.  You feel faint.  You have  severe pain in your chest or abdomen.  You vomit repeatedly.  You have trouble breathing. Summary  Hypertension is when the force of blood pumping through your arteries is too strong. If this condition is not controlled, it may put you at risk for serious complications.  Your personal target blood pressure may vary depending on your medical conditions, your age, and other factors. For most people, a normal blood pressure is less than 120/80.  Hypertension is treated with lifestyle changes, medicines, or a combination of both. Lifestyle changes include weight loss, eating a healthy, low-sodium diet, exercising more, and limiting alcohol. This information is not intended to replace advice given to you by your health care provider. Make sure you discuss any questions you have with your health care provider. Document Released: 11/18/2005 Document  Revised: 10/16/2016 Document Reviewed: 10/16/2016 Elsevier Interactive Patient Education  2017 [employer].

## 2017-02-03 NOTE — Progress Notes (Addendum)
Subjective: Chief Complaint  Patient presents with  . high b/p    pt came in to have labs and B/P check per [facility] B/P was 150/100   Here for BP recheck. He was here for labs, but BP was elevated.  Work in appt.    Last visit he began quinapril 10mg  daily.  Checking BP at home with cuff.   SBP usually 140, DBP 80-low 100 range.   Feels fine, no c/o.  No side effects of medication.  No other aggravating or relieving factors. No other complaint.   Past Medical History:  Diagnosis Date  . Allergy    seasonal   . Back pain    intermittent pain, sees Dr. Limmie Patricia, Chiropractic  . Family history of premature CAD   . Hypertension   . Impaired fasting blood sugar   . Mixed dyslipidemia 2005   hereditary  . Obesity   . Wears glasses    Current Outpatient Prescriptions on File Prior to Visit  Medication Sig Dispense Refill  . aspirin EC 81 MG tablet Take 1 tablet (81 mg total) by mouth daily. 90 tablet 3  . omega-3 acid ethyl esters (LOVAZA) 1 g capsule Take 2 capsules (2 g total) by mouth 2 (two) times daily. 120 capsule 3  . rosuvastatin (CRESTOR) 20 MG tablet Take 1 tablet (20 mg total) by mouth daily. 90 tablet 3   No current facility-administered medications on file prior to visit.    ROS as in subjective   Objective: BP (!) 150/100   Pulse 72   Wt 234 lb (106.1 kg)   SpO2 97%   BMI 30.04 kg/m   BP Readings from Last 3 Encounters:  02/03/17 (!) 150/100  12/04/16 140/70  07/08/16 140/80   Wt Readings from Last 3 Encounters:  02/03/17 234 lb (106.1 kg)  12/04/16 239 lb 3.2 oz (108.5 kg)  07/08/16 246 lb 9.6 oz (111.9 kg)   General appearance: alert, no distress, WD/WN,  Heart: RRR, normal S1, S2, no murmurs Lungs: CTA bilaterally, no wheezes, rhonchi, or rales Pulses: 2+ symmetric, upper and lower extremities, normal cap refill Ext: no edema    Assessment: Encounter Diagnoses  Name Primary?  . Essential hypertension, benign Yes  . Encounter for health  maintenance examination in adult   . Low testosterone   . Mixed dyslipidemia   . High risk medication use     Plan Change from plain quinapril to quinapril HCT.   C/t to monitor home BPs.  We weill call tomorrow with lab results.  C/t healthy diet, exercise, limiting salt.    Dempsey was seen today for high b/p.  Diagnoses and all orders for this visit:  Essential hypertension, benign -     Basic metabolic panel  Encounter for health maintenance examination in adult -     Comprehensive metabolic panel -     Lipid panel -     Testosterone  Low testosterone -     Comprehensive metabolic panel -     Lipid panel -     Testosterone  Mixed dyslipidemia -     Comprehensive metabolic panel -     Lipid panel -     Testosterone  High risk medication use -     Basic metabolic panel  Other orders -     quinapril-hydrochlorothiazide (ACCURETIC) 20-12.5 MG tablet; Take 1 tablet by mouth daily.

## 2017-02-04 LAB — TESTOSTERONE: Testosterone: 270 ng/dL (ref 250–827)

## 2017-02-06 ENCOUNTER — Other Ambulatory Visit: Payer: Self-pay | Admitting: Medical

## 2017-02-06 MED ORDER — ICOSAPENT ETHYL 1 G PO CAPS: 2.0000 | ORAL_CAPSULE | Freq: Two times a day (BID) | ORAL | 3 refills | Status: DC

## 2017-03-03 ENCOUNTER — Encounter: Payer: Self-pay | Admitting: Medical

## 2017-04-30 ENCOUNTER — Other Ambulatory Visit: Payer: Self-pay | Admitting: Medical

## 2017-05-08 ENCOUNTER — Telehealth: Payer: Self-pay | Admitting: Medical

## 2017-05-08 NOTE — Telephone Encounter (Signed)
Three boxes of vascepa in sample drawer. One has expired to be disposed of. The other two returned to sample closet.

## 2017-06-06 ENCOUNTER — Other Ambulatory Visit: Payer: Self-pay | Admitting: Medical

## 2017-10-03 ENCOUNTER — Other Ambulatory Visit: Payer: Self-pay | Admitting: Medical

## 2017-11-02 ENCOUNTER — Encounter: Payer: Self-pay | Admitting: Medical

## 2017-11-04 ENCOUNTER — Other Ambulatory Visit: Payer: Self-pay | Admitting: Medical

## 2017-11-04 DIAGNOSIS — E782 Mixed hyperlipidemia: Secondary | ICD-10-CM

## 2017-11-04 DIAGNOSIS — R7301 Impaired fasting glucose: Secondary | ICD-10-CM

## 2017-11-04 DIAGNOSIS — E669 Obesity, unspecified: Secondary | ICD-10-CM

## 2017-11-04 DIAGNOSIS — I10 Essential (primary) hypertension: Secondary | ICD-10-CM

## 2017-11-04 DIAGNOSIS — Z0000 Encounter for general adult medical examination without abnormal findings: Secondary | ICD-10-CM

## 2017-11-04 MED ORDER — ICOSAPENT ETHYL 1 G PO CAPS: 2.0000 | ORAL_CAPSULE | Freq: Two times a day (BID) | ORAL | 1 refills | Status: DC

## 2017-11-04 MED ORDER — QUINAPRIL-HYDROCHLOROTHIAZIDE 20-12.5 MG PO TABS: 1.0000 | ORAL_TABLET | Freq: Every day | ORAL | 0 refills | Status: DC

## 2017-11-04 MED ORDER — ROSUVASTATIN CALCIUM 20 MG PO TABS: 20.0000 mg | ORAL_TABLET | Freq: Every day | ORAL | 0 refills | Status: DC

## 2018-01-01 ENCOUNTER — Other Ambulatory Visit: Payer: 59

## 2018-01-01 DIAGNOSIS — Z0000 Encounter for general adult medical examination without abnormal findings: Secondary | ICD-10-CM | POA: Diagnosis not present

## 2018-01-01 LAB — LIPID PANEL
Chol/HDL Ratio: 4.5 ratio (ref 0.0–5.0)
Cholesterol, Total: 153 mg/dL (ref 100–199)
HDL: 34 mg/dL — AB (ref >39)
LDL CALC: 71 mg/dL (ref 0–99)
TRIGLYCERIDES: 239 mg/dL — AB (ref 0–149)
VLDL Cholesterol Cal: 48 mg/dL — ABNORMAL HIGH (ref 5–40)

## 2018-01-01 LAB — COMPREHENSIVE METABOLIC PANEL
ALK PHOS: 62 IU/L (ref 39–117)
ALT: 17 IU/L (ref 0–44)
AST: 24 IU/L (ref 0–40)
Albumin/Globulin Ratio: 1.7 (ref 1.2–2.2)
Albumin: 4.7 g/dL (ref 3.5–5.5)
BUN/Creatinine Ratio: 18 (ref 9–20)
BUN: 16 mg/dL (ref 6–24)
Bilirubin Total: 0.7 mg/dL (ref 0.0–1.2)
CO2: 25 mmol/L (ref 20–29)
CREATININE: 0.88 mg/dL (ref 0.76–1.27)
Calcium: 9.9 mg/dL (ref 8.7–10.2)
Chloride: 101 mmol/L (ref 96–106)
GFR calc Af Amer: 109 mL/min/{1.73_m2} (ref >59)
GFR calc non Af Amer: 95 mL/min/{1.73_m2} (ref >59)
GLUCOSE: 114 mg/dL — AB (ref 65–99)
Globulin, Total: 2.8 g/dL (ref 1.5–4.5)
Potassium: 5.1 mmol/L (ref 3.5–5.2)
Sodium: 141 mmol/L (ref 134–144)
Total Protein: 7.5 g/dL (ref 6.0–8.5)

## 2018-01-01 LAB — CBC
HEMATOCRIT: 43.7 % (ref 37.5–51.0)
HEMOGLOBIN: 14.9 g/dL (ref 13.0–17.7)
MCH: 28.5 pg (ref 26.6–33.0)
MCHC: 34.1 g/dL (ref 31.5–35.7)
MCV: 84 fL (ref 79–97)
Platelets: 259 10*3/uL (ref 150–379)
RBC: 5.22 x10E6/uL (ref 4.14–5.80)
RDW: 14.1 % (ref 12.3–15.4)
WBC: 6.9 10*3/uL (ref 3.4–10.8)

## 2018-01-01 LAB — HEMOGLOBIN A1C
Est. average glucose Bld gHb Est-mCnc: 126 mg/dL
HEMOGLOBIN A1C: 6 % — AB (ref 4.8–5.6)

## 2018-01-01 NOTE — Addendum Note (Signed)
Addended by: Carolee Rota F on: 01/01/2018 08:34 AM   Modules accepted: Orders

## 2018-01-12 ENCOUNTER — Encounter: Payer: Self-pay | Admitting: Medical

## 2018-01-12 ENCOUNTER — Ambulatory Visit: Payer: 59 | Admitting: Medical

## 2018-01-12 VITALS — BP 148/88 | HR 80 | Ht 71.0 in | Wt 237.6 lb

## 2018-01-12 DIAGNOSIS — Z7189 Other specified counseling: Secondary | ICD-10-CM | POA: Diagnosis not present

## 2018-01-12 DIAGNOSIS — R7301 Impaired fasting glucose: Secondary | ICD-10-CM | POA: Diagnosis not present

## 2018-01-12 DIAGNOSIS — Z8249 Family history of ischemic heart disease and other diseases of the circulatory system: Secondary | ICD-10-CM | POA: Diagnosis not present

## 2018-01-12 DIAGNOSIS — I10 Essential (primary) hypertension: Secondary | ICD-10-CM | POA: Diagnosis not present

## 2018-01-12 DIAGNOSIS — Z0000 Encounter for general adult medical examination without abnormal findings: Secondary | ICD-10-CM

## 2018-01-12 DIAGNOSIS — E782 Mixed hyperlipidemia: Secondary | ICD-10-CM | POA: Diagnosis not present

## 2018-01-12 DIAGNOSIS — Z7185 Encounter for immunization safety counseling: Secondary | ICD-10-CM

## 2018-01-12 MED ORDER — NIACIN ER 250 MG PO CPCR: 250.0000 mg | ORAL_CAPSULE | Freq: Every day | ORAL | 11 refills | Status: DC

## 2018-01-12 MED ORDER — OLMESARTAN-AMLODIPINE-HCTZ 40-10-25 MG PO TABS: 1.0000 | ORAL_TABLET | Freq: Every day | ORAL | 3 refills | Status: DC

## 2018-01-12 MED ORDER — ASPIRIN EC 81 MG PO TBEC: 81.0000 mg | DELAYED_RELEASE_TABLET | Freq: Every day | ORAL | 3 refills | Status: DC

## 2018-01-12 MED ORDER — ROSUVASTATIN CALCIUM 20 MG PO TABS: 20.0000 mg | ORAL_TABLET | Freq: Every day | ORAL | 3 refills | Status: DC

## 2018-01-12 NOTE — Patient Instructions (Addendum)
  Thank you for giving me the opportunity to serve you today.    Your diagnosis today includes: Encounter Diagnoses  Name Primary?  . Encounter for health maintenance examination in adult Yes  . Essential hypertension   . Impaired fasting blood sugar   . Mixed dyslipidemia   . Family history of premature CAD   . Vaccine counseling     Recommendations:  Eat a healthy low fat diet  Exercise most days per week, at least 30 minutes or more  See your eye doctor yearly for routine vision care.  See your dentist yearly for routine dental care including hygiene visits twice yearly.  I recommend you have a Shingles Vaccine to help prevent shingles or herpes zoster outbreak.   Please call your insurer to inquire about coverage for the Shingrix vaccine given in 2 doses.   Some insurers cover this vaccine after age 78, some cover this after age 57.  If your insurer covers this, then call to schedule appointment to have this vaccine here.  I recommend a yearly Influenza/Flu vaccine, typically in September to help reduce the risk of you and others getting the flu illness  We will call with lab results  See me yearly for a physical   High blood pressure  Stop your current blood pressure pill and change to the 3 and 1 combination medication called Tribenzor, also known as olmesartan-amlodipine-HCTZ 1 tablet daily in the morning  Cholesterol, low HDL, elevated triglycerides  Finish out your current [employer], then stop this  Continue Crestor daily at bedtime along with aspirin daily at bedtime  Lets begin a trial of niacin starting every other day.  This medicine can help improve your HDL cholesterol along with healthy diet and exercise  Recommendations for improving lipids:  Foods to avoid or limit - fried foods, high sugar foods, white bread, enriched flour, fast food, red meat, large amounts of cheese, processed foods such as little debbie cakes, cookies, pies, donuts, for  example  Foods to include in the diet - whole grains such as whole grain pasta, whole grain bread, barley, steel cut oatmeal (not instant oatmeal), avocado, fish, green leafy vegetables, nuts, increased fiber in diet, and using olive oil in small amounts for cooking or as salad dressing vinaigrette.    Let us plan to recheck on blood pressure and cholesterol in a few months fasting

## 2018-01-12 NOTE — Progress Notes (Signed)
Subjective:   HPI  Steven Fletcher is a 57 y.o. male who presents for a complete physical.    Concerns: None, compliant with medication without complaint  Headed to [place] in a few weeks to see in the [place] an to sight see.  Reviewed their medical, surgical, family, social, medication, and allergy history and updated chart as appropriate.  Past Medical History:  Diagnosis Date  . Allergy    seasonal   . Back pain    intermittent pain, sees Dr. Limmie Patricia, Chiropractic  . Family history of premature CAD   . Hypertension   . Impaired fasting blood sugar   . Mixed dyslipidemia 2005   hereditary  . Obesity   . Wears glasses     Past Surgical History:  Procedure Laterality Date  . COLONOSCOPY  2010   [place]; normal, repeat 2020  . elbow Right    arthroscopic for cartilege 1980, 1984, 1989  . ELBOW SURGERY Right 1990   move nerve  . KNEE CARTILAGE SURGERY Bilateral 2003, 2005     x 2, R 2005, L 2003  . TONSILLECTOMY    . WISDOM TOOTH EXTRACTION    . wrist sugery Right 2000    bone spur    Social History   Socioeconomic History  . Marital status: Legally Separated    Spouse name: Not on file  . Number of children: Not on file  . Years of education: Not on file  . Highest education level: Not on file  Social Needs  . Financial resource strain: Not on file  . Food insecurity - worry: Not on file  . Food insecurity - inability: Not on file  . Transportation needs - medical: Not on file  . Transportation needs - non-medical: Not on file  Occupational History  . 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 Smoking status: Never Smoker  . Smokeless tobacco: Never Used  Substance and Sexual Activity  . Alcohol use: Yes    Alcohol/week: 1.8 oz    Types: 3 Cans of beer per week    Comment: occasional  . Drug use: No  . Sexual activity: Not Currently  Other Topics Concern  . 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  Working [employer], Merchant navy officer.  61yo child.   No significant other.  As of 12/2016    Family History  Problem Relation Age of Onset  . Alzheimer's disease Mother   . Hyperlipidemia Mother   . Hypertension Father   . Other Father        died of old age  . Cancer Sister        breast  . Hyperlipidemia Sister   . Heart disease Brother 51       died of MI  . Hyperlipidemia Brother   . Diabetes Maternal Grandmother   . Diabetes Paternal Grandmother   . Stroke Neg Hx      Current Outpatient Medications:  .  aspirin EC 81 MG tablet, Take 1 tablet (81 mg total) by mouth daily., Disp: 90 tablet, Rfl: 3 .  rosuvastatin (CRESTOR) 20 MG tablet, Take 1 tablet (20 mg total) by mouth daily., Disp: 90 tablet, Rfl: 3 .  niacin 250 MG CR capsule, Take 1 capsule (250 mg total) by mouth at bedtime., Disp: 30 capsule, Rfl: 11 .  Olmesartan-Amlodipine-HCTZ  40-10-25 MG TABS, Take 1 tablet by mouth daily., Disp: 90 tablet, Rfl: 3  No Known Allergies  Review of Systems Constitutional: -fever, -chills, -sweats, -unexpected weight change, -decreased appetite, -fatigue Allergy: -sneezing, -itching, -congestion Dermatology: -changing moles, --rash, +lumps ENT: -runny nose, -ear pain, -sore throat, -hoarseness, -sinus pain, -teeth pain, - ringing in ears, -hearing loss, -nosebleeds Cardiology: -chest pain, -palpitations, -swelling, -difficulty breathing when lying flat, -waking up short of breath Respiratory: -cough, -shortness of breath, -difficulty breathing with exercise or exertion, -wheezing, -coughing up blood Gastroenterology: -abdominal pain, -nausea, -vomiting, -diarrhea, -constipation, -blood in stool, -changes in bowel movement, -difficulty swallowing or eating Hematology: -bleeding, -bruising  Musculoskeletal: -joint aches, -muscle aches, -joint swelling, -back pain, -neck pain, -cramping,  -changes in gait Ophthalmology: denies vision changes, eye redness, itching, discharge Urology: -burning with urination, -difficulty urinating, -blood in urine, -urinary frequency, -urgency, -incontinence Neurology: -headache, -weakness, -tingling, -numbness, -memory loss, -falls, -dizziness Psychology: -depressed mood, -agitation, -sleep problems     Objective:   Physical Exam  BP (!) 148/88   Pulse 80   Ht 5\' 11"  (1.803 m)   Wt 237 lb 9.6 oz (107.8 kg)   SpO2 98%   BMI 33.14 kg/m    General appearance: alert, no distress, WD/WN, white male Skin: scattered macules,left middle finger and lateral edge of fingernail base with raised pinkish area, but no obvious foreign body, fluctuance, or warmth HEENT: normocephalic, conjunctiva/corneas normal, sclerae anicteric, PERRLA, EOMi, nares patent, no discharge or erythema, pharynx normal Oral cavity: MMM, tongue normal, teeth normal Neck: supple, no lymphadenopathy, no thyromegaly, no masses, normal ROM, no bruits Chest: non tender, normal shape and expansion Heart: RRR, normal S1, S2, no murmurs Lungs: CTA bilaterally, no wheezes, rhonchi, or rales Abdomen: +bs, soft, bulge in upper abdomen c/w diastasis recti, non tender, non distended, no masses, no hepatomegaly, no splenomegaly, no bruits Back: non tender, normal ROM, no scoliosis Musculoskeletal: right thumb DIP with bony arthritic changes, right dorsal wrist laterally with small surgical scar, right medial and lateral elbow surgical scars,  anterior port surgical scars both knees, upper extremities non tender, no obvious deformity, normal ROM throughout, lower extremities non tender, no obvious deformity, normal ROM throughout Extremities: no edema, no cyanosis, no clubbing Pulses: 2+ symmetric, upper and lower extremities, normal cap refill Neurological: alert, oriented x 3, CN2-12 intact, strength normal upper extremities and lower extremities, sensation normal throughout, DTRs 2+  throughout, no cerebellar signs, gait normal Psychiatric: normal affect, behavior normal, pleasant  GU: normal male external genitalia, circumcised, nontender, no masses, no hernia, no lymphadenopathy Rectal: anus normal tone, prostate WNL, occult negative stool   Adult ECG Report  Indication: physical, HTN  Rate: 78 bpm  Rhythm: normal sinus rhythm  QRS Axis: 54 degrees  PR Interval: 127ms  QRS Duration: 122ms  QTc: 481ms  Conduction Disturbances: none  Other Abnormalities: none  Patient's cardiac risk factors are: dyslipidemia and hypertension.  EKG comparison: 2015 EKG  Narrative Interpretation:  No acute changes     Assessment and Plan :    Encounter Diagnoses  Name Primary?  . Encounter for health maintenance examination in adult Yes  . Essential hypertension   . Impaired fasting blood sugar   . Mixed dyslipidemia   . Family history of premature CAD   . Vaccine counseling     Physical exam - discussed healthy lifestyle, diet, exercise, preventative care, vaccinations, and addressed their concerns.   See eye doctor and dentist yearly.  Gets flu shot free at work  Counseled on the new Shingrix vaccine coming out.  He will check insurance for coverage Work on some weight loss through healthy diet and exercise.  High blood pressure  Stop your current blood pressure pill and change to the 3 and 1 combination medication called Tribenzor, also known as olmesartan-amlodipine-HCTZ 1 tablet daily in the morning  Cholesterol, low HDL, elevated triglycerides  Finish out your current [employer], then stop this  Continue Crestor daily at bedtime along with aspirin daily at bedtime  Lets begin a trial of niacin starting every other day.  This medicine can help improve your HDL cholesterol along with healthy diet and exercise  Recommendations for improving lipids:  Foods to avoid or limit - fried foods, high sugar foods, white bread, enriched flour, fast food, red meat,  large amounts of cheese, processed foods such as little debbie cakes, cookies, pies, donuts, for example   Steven Fletcher was seen today for annual exam.  Diagnoses and all orders for this visit:  Encounter for health maintenance examination in adult  Essential hypertension  Impaired fasting blood sugar  Mixed dyslipidemia  Family history of premature CAD  Vaccine counseling  Other orders -     aspirin EC 81 MG tablet; Take 1 tablet (81 mg total) by mouth daily. -     rosuvastatin (CRESTOR) 20 MG tablet; Take 1 tablet (20 mg total) by mouth daily. -     niacin 250 MG CR capsule; Take 1 capsule (250 mg total) by mouth at bedtime. -     Olmesartan-Amlodipine-HCTZ 40-10-25 MG TABS; Take 1 tablet by mouth daily.

## 2018-01-15 NOTE — Addendum Note (Signed)
Addended by: Carlena Hurl on: 01/15/2018 08:37 PM   Modules accepted: Orders

## 2018-01-19 ENCOUNTER — Other Ambulatory Visit: Payer: Self-pay

## 2018-01-19 ENCOUNTER — Telehealth: Payer: Self-pay | Admitting: Medical

## 2018-01-19 MED ORDER — OLMESARTAN-AMLODIPINE-HCTZ 40-10-25 MG PO TABS: 1.0000 | ORAL_TABLET | Freq: Every day | ORAL | 0 refills | Status: DC

## 2018-01-19 MED ORDER — ROSUVASTATIN CALCIUM 20 MG PO TABS: 20.0000 mg | ORAL_TABLET | Freq: Every day | ORAL | 0 refills | Status: DC

## 2018-01-19 NOTE — Telephone Encounter (Signed)
Pt states hasn't gotten Crestor nor Tribenzor from mail order yet due to new set up and taking longer for processing and he is going out of town tomorrow and needs #[phone] sent to local pharmacy to hold him.  Please send in to [facility]

## 2018-01-19 NOTE — Telephone Encounter (Signed)
Sent refill to [facility] and called and notified

## 2018-02-08 ENCOUNTER — Encounter: Payer: Self-pay | Admitting: Medical

## 2018-02-19 ENCOUNTER — Other Ambulatory Visit: Payer: Self-pay | Admitting: Medical

## 2018-02-19 ENCOUNTER — Telehealth: Payer: Self-pay

## 2018-02-19 MED ORDER — NIACIN ER 250 MG PO CPCR: 250.0000 mg | ORAL_CAPSULE | Freq: Every day | ORAL | 3 refills | Status: DC

## 2018-02-19 NOTE — Telephone Encounter (Signed)
Called patient and advised that we had form here from mychart that I filled out, also advised the his RX was here and we needed to know if he wanted to have them mailed or picked up. Forms are on my desk in purple folder

## 2018-12-27 ENCOUNTER — Other Ambulatory Visit: Payer: Self-pay | Admitting: Medical

## 2019-03-05 ENCOUNTER — Other Ambulatory Visit: Payer: Self-pay

## 2019-03-05 ENCOUNTER — Other Ambulatory Visit: Payer: Self-pay | Admitting: Medical

## 2019-03-05 ENCOUNTER — Encounter: Payer: Self-pay | Admitting: Medical

## 2019-03-05 ENCOUNTER — Ambulatory Visit: Payer: 59 | Admitting: Medical

## 2019-03-05 VITALS — BP 126/82 | HR 75 | Temp 98.8°F | Resp 16 | Ht 72.0 in | Wt 238.0 lb

## 2019-03-05 DIAGNOSIS — Z7185 Encounter for immunization safety counseling: Secondary | ICD-10-CM

## 2019-03-05 DIAGNOSIS — I10 Essential (primary) hypertension: Secondary | ICD-10-CM | POA: Diagnosis not present

## 2019-03-05 DIAGNOSIS — Z1211 Encounter for screening for malignant neoplasm of colon: Secondary | ICD-10-CM

## 2019-03-05 DIAGNOSIS — E782 Mixed hyperlipidemia: Secondary | ICD-10-CM

## 2019-03-05 DIAGNOSIS — Z0000 Encounter for general adult medical examination without abnormal findings: Secondary | ICD-10-CM

## 2019-03-05 DIAGNOSIS — Z7189 Other specified counseling: Secondary | ICD-10-CM

## 2019-03-05 DIAGNOSIS — R7301 Impaired fasting glucose: Secondary | ICD-10-CM | POA: Diagnosis not present

## 2019-03-05 DIAGNOSIS — Z125 Encounter for screening for malignant neoplasm of prostate: Secondary | ICD-10-CM

## 2019-03-05 MED ORDER — ROSUVASTATIN CALCIUM 20 MG PO TABS: 20.0000 mg | ORAL_TABLET | Freq: Every day | ORAL | 0 refills | Status: DC

## 2019-03-05 MED ORDER — OLMESARTAN-AMLODIPINE-HCTZ 40-10-25 MG PO TABS: 1.0000 | ORAL_TABLET | Freq: Every day | ORAL | 3 refills | Status: DC

## 2019-03-05 MED ORDER — ASPIRIN EC 81 MG PO TBEC: 81.0000 mg | DELAYED_RELEASE_TABLET | Freq: Every day | ORAL | 3 refills | Status: DC

## 2019-03-05 MED ORDER — NIACIN ER 500 MG PO CPCR: 500.0000 mg | ORAL_CAPSULE | Freq: Every day | ORAL | 0 refills | Status: DC

## 2019-03-05 NOTE — Progress Notes (Signed)
Subjective:     Patient ID: Steven Fletcher, male   DOB: 09-23-1961, 58 y.o.   MRN: [id number]  Documentation for virtual audio and video telecommunications through [facility] encounter:  The patient was located at home. The provider was located in the office. The patient did consent to this visit and is aware of possible charges through their insurance for this visit.  The other persons participating in this telemedicine service were none. Time spent on call was 20 minutes and in review of previous records >25  minutes total.  This virtual service is not related to other E/M service within previous 7 days.   HPI Chief Complaint  Patient presents with  . med check    virtual med check    Virtual visit today for med check  Hypertension-compliant with Tribenzor 40/10/25 mg daily.  He notes about once or twice a month he will get a little lightheaded if he stands up too fast.  Checking blood pressures and they normally run in the 299M systolic/ 60 to 42A diastolic.  Is exercising.  Denies syncope, chest pain, dizziness, shortness of breath, palpitations or other symptoms with exercise.  He is also doing some home remodeling with his kitchen staying active in general.  Hyperlipidemia- he is taking niacin 500 mg daily over-the-counter.  Insurance would not cover prescription niacin or Vascepa last year.  He is not taking statin as he thought he was supposed to stop this last visit which was not the case.  He checked insurance and shingles vaccine is covered.  He will plan to do this.  He has no other complaint  Past Medical History:  Diagnosis Date  . Allergy    seasonal   . Back pain    intermittent pain, sees Dr. Limmie Patricia, Chiropractic  . Family history of premature CAD   . Hypertension   . Impaired fasting blood sugar   . Mixed dyslipidemia 2005   hereditary  . Obesity   . Wears glasses    No current outpatient medications on file prior to visit.   No current facility-administered  medications on file prior to visit.     Review of Systems As in subjective     Objective:   Physical Exam   Vitals included home readings for blood pressure weight and temp.  Respirations were done virtually with inspection by CMA  BP 126/82   Pulse 75   Temp 98.8 F (37.1 C) (Oral)   Resp 16   Ht 6' (1.829 m)   Wt 238 lb (108 kg)   BMI 32.28 kg/m   Wt Readings from Last 3 Encounters:  03/05/19 238 lb (108 kg)  01/12/18 237 lb 9.6 oz (107.8 kg)  02/03/17 234 lb (106.1 kg)   Due to coronavirus pandemic stay at home measures, patient visit was virtual and they were not examined in person.  He appears well, no acute distress, well-developed, well-nourished      Assessment:     Encounter Diagnoses  Name Primary?  . Essential hypertension Yes  . Impaired fasting blood sugar   . Mixed dyslipidemia   . Vaccine counseling   . Screen for colon cancer        Plan:     Hypertension-continue same medication, but advised that he continues to get dizziness or if this is occurring more frequently then we might need to back off the dose a little.  He declines to do this at this time.  He feels like the issue is  a minor issue  Impaired glucose-plan to recheck labs at a physical visit in the summer 2020  Dyslipidemia- unfortunately he was not clear on the instructions from last year to continue statin so he will restart statin.  He is continuing niacin 500 mg daily with counter.  Plan for fasting labs in the summer  Screening for colonoscopy- we will go ahead and put in a referral as he is due at this time  Vaccine counseling-he will plan to get the Shingrix vaccine when he comes in during the summer for physical and fasting labs  Plan for full labs at physical in the summer including comprehensive metabolic panel, PSA, hemoglobin A1c, CBC, lipid panel  Saw was seen today for med check.  Diagnoses and all orders for this visit:  Essential hypertension  Impaired fasting  blood sugar  Mixed dyslipidemia  Vaccine counseling  Screen for colon cancer -     Ambulatory referral to Gastroenterology  Other orders -     rosuvastatin (CRESTOR) 20 MG tablet; Take 1 tablet (20 mg total) by mouth daily. -     Olmesartan-amLODIPine-HCTZ 40-10-25 MG TABS; Take 1 tablet by mouth daily. -     aspirin EC 81 MG tablet; Take 1 tablet (81 mg total) by mouth daily. -     niacin 500 MG CR capsule; Take 1 capsule (500 mg total) by mouth at bedtime.

## 2019-05-11 ENCOUNTER — Other Ambulatory Visit: Payer: Self-pay | Admitting: Medical

## 2019-05-26 ENCOUNTER — Ambulatory Visit: Payer: 59 | Admitting: *Deleted

## 2019-05-26 ENCOUNTER — Other Ambulatory Visit: Payer: Self-pay

## 2019-05-26 VITALS — Ht 72.0 in | Wt 235.0 lb

## 2019-05-26 DIAGNOSIS — Z1211 Encounter for screening for malignant neoplasm of colon: Secondary | ICD-10-CM

## 2019-05-26 MED ORDER — NA SULFATE-K SULFATE-MG SULF 17.5-3.13-1.6 GM/177ML PO SOLN: 1.0000 | Freq: Once | ORAL | 0 refills | Status: AC

## 2019-05-26 NOTE — Progress Notes (Signed)

## 2019-05-27 ENCOUNTER — Encounter: Payer: Self-pay | Admitting: Gastroenterology

## 2019-06-02 HISTORY — PX: COLONOSCOPY: SHX174

## 2019-06-08 ENCOUNTER — Telehealth: Payer: Self-pay | Admitting: Gastroenterology

## 2019-06-08 NOTE — Telephone Encounter (Signed)

## 2019-06-09 ENCOUNTER — Encounter: Payer: Self-pay | Admitting: Gastroenterology

## 2019-06-09 ENCOUNTER — Ambulatory Visit (AMBULATORY_SURGERY_CENTER): Payer: 59 | Admitting: Gastroenterology

## 2019-06-09 ENCOUNTER — Other Ambulatory Visit: Payer: Self-pay

## 2019-06-09 VITALS — BP 105/70 | HR 66 | Temp 98.8°F | Resp 17 | Ht 72.0 in | Wt 235.0 lb

## 2019-06-09 DIAGNOSIS — D120 Benign neoplasm of cecum: Secondary | ICD-10-CM

## 2019-06-09 DIAGNOSIS — K635 Polyp of colon: Secondary | ICD-10-CM

## 2019-06-09 DIAGNOSIS — Z1211 Encounter for screening for malignant neoplasm of colon: Secondary | ICD-10-CM

## 2019-06-09 MED ORDER — SODIUM CHLORIDE 0.9 % IV SOLN: 500.0000 mL | Freq: Once | INTRAVENOUS | Status: DC

## 2019-06-09 NOTE — Progress Notes (Signed)
A/ox3, pleased with MAC, report to RN 

## 2019-06-09 NOTE — Op Note (Signed)
[facility] Patient Name: Steven Fletcher Procedure Date: 06/09/2019 10:15 AM MRN: [id number] Endoscopist: Mauri Pole , MD Age: 58 Referring MD:  Date of Birth: [birth year] Gender: Male Account #: [id number] Procedure:                Colonoscopy Indications:              Screening for colorectal malignant neoplasm Medicines:                Monitored Anesthesia Care Procedure:                Pre-Anesthesia Assessment:                           - Prior to the procedure, a History and Physical                            was performed, and patient medications and                            allergies were reviewed. The patient's tolerance of                            previous anesthesia was also reviewed. The risks                            and benefits of the procedure and the sedation                            options and risks were discussed with the patient.                            All questions were answered, and informed consent                            was obtained. Prior Anticoagulants: The patient has                            taken no previous anticoagulant or antiplatelet                            agents. ASA Grade Assessment: II - A patient with                            mild systemic disease. After reviewing the risks                            and benefits, the patient was deemed in                            satisfactory condition to undergo the procedure.                           After obtaining informed consent, the colonoscope  was passed under direct vision. Throughout the                            procedure, the patient's blood pressure, pulse, and                            oxygen saturations were monitored continuously. The                            Model PCF-H190DL [id number]) scope was introduced                            through the anus and advanced to the the cecum,                            identified by  appendiceal orifice and ileocecal                            valve. The colonoscopy was performed without                            difficulty. The patient tolerated the procedure                            well. The quality of the bowel preparation was                            excellent. The ileocecal valve, appendiceal                            orifice, and rectum were photographed. Scope In: 10:21:46 AM Scope Out: 10:37:45 AM Scope Withdrawal Time: 0 hours 12 minutes 30 seconds  Total Procedure Duration: 0 hours 15 minutes 59 seconds  Findings:                 The perianal and digital rectal examinations were                            normal.                           Two sessile polyps were found in the transverse                            colon and cecum. The polyps were 2 to 3 mm in size.                            These polyps were removed with a cold biopsy                            forceps. Resection and retrieval were complete.                           A few small-mouthed diverticula were found in the  sigmoid colon and descending colon.                           Non-bleeding internal hemorrhoids were found during                            retroflexion. The hemorrhoids were medium-sized.                           The exam was otherwise without abnormality. Complications:            No immediate complications. Estimated Blood Loss:     Estimated blood loss was minimal. Impression:               - Two 2 to 3 mm polyps in the transverse colon and                            in the cecum, removed with a cold biopsy forceps.                            Resected and retrieved.                           - Diverticulosis in the sigmoid colon and in the                            descending colon.                           - Non-bleeding internal hemorrhoids.                           - The examination was otherwise normal. Recommendation:           -  Patient has a contact number available for                            emergencies. The signs and symptoms of potential                            delayed complications were discussed with the                            patient. Return to normal activities tomorrow.                            Written discharge instructions were provided to the                            patient.                           - Resume previous diet.                           - Continue present medications.                           -  Await pathology results.                           - Repeat colonoscopy in 5-10 years for surveillance                            based on pathology results. Mauri Pole, MD 06/09/2019 10:44:00 AM This report has been signed electronically.

## 2019-06-09 NOTE — Progress Notes (Signed)
Called to room to assist during endoscopic procedure.  Patient ID and intended procedure confirmed with present staff. Received instructions for my participation in the procedure from the performing physician.  

## 2019-06-09 NOTE — Patient Instructions (Signed)
YOU HAD AN ENDOSCOPIC PROCEDURE TODAY AT [facility]:   Refer to the procedure report that was given to you for any specific questions about what was found during the examination.  If the procedure report does not answer your questions, please call your gastroenterologist to clarify.  If you requested that your care partner not be given the details of your procedure findings, then the procedure report has been included in a sealed envelope for you to review at your convenience later.  YOU SHOULD EXPECT: Some feelings of bloating in the abdomen. Passage of more gas than usual.  Walking can help get rid of the air that was put into your GI tract during the procedure and reduce the bloating. If you had a lower endoscopy (such as a colonoscopy or flexible sigmoidoscopy) you may notice spotting of blood in your stool or on the toilet paper. If you underwent a bowel prep for your procedure, you may not have a normal bowel movement for a few days.  Please Note:  You might notice some irritation and congestion in your nose or some drainage.  This is from the oxygen used during your procedure.  There is no need for concern and it should clear up in a day or so.  SYMPTOMS TO REPORT IMMEDIATELY:   Following lower endoscopy (colonoscopy or flexible sigmoidoscopy):  Excessive amounts of blood in the stool  Significant tenderness or worsening of abdominal pains  Swelling of the abdomen that is new, acute  Fever of 100F or higher  For urgent or emergent issues, a gastroenterologist can be reached at any hour by calling [phone].   DIET:  We do recommend a small meal at first, but then you may proceed to your regular diet.  Drink plenty of fluids but you should avoid alcoholic beverages for 24 hours.  ACTIVITY:  You should plan to take it easy for the rest of today and you should NOT DRIVE or use heavy machinery until tomorrow (because of the sedation medicines used during the test).     FOLLOW UP: Our staff will call the number listed on your records 48-72 hours following your procedure to check on you and address any questions or concerns that you may have regarding the information given to you following your procedure. If we do not reach you, we will leave a message.  We will attempt to reach you two times.  During this call, we will ask if you have developed any symptoms of COVID 19. If you develop any symptoms (ie: fever, flu-like symptoms, shortness of breath, cough etc.) before then, please call [phone].  If you test positive for Covid 19 in the 2 weeks post procedure, please call and report this information to [place].    If any biopsies were taken you will be contacted by phone or by letter within the next 1-3 weeks.  Please call us at [phone] if you have not heard about the biopsies in 3 weeks.    SIGNATURES/CONFIDENTIALITY: You and/or your care partner have signed paperwork which will be entered into your electronic medical record.  These signatures attest to the fact that that the information above on your After Visit Summary has been reviewed and is understood.  Full responsibility of the confidentiality of this discharge information lies with you and/or your care-partner.  Await pathology  Next colonoscopy in 5-10 years  Please read over handouts about polyps, hemorrhoids and diverticulosis  Continue your normal medications

## 2019-06-09 NOTE — Progress Notes (Signed)
Pt's states no medical or surgical changes since previsit or office visit.  Temp CW Vitals JB 

## 2019-06-11 ENCOUNTER — Telehealth: Payer: Self-pay

## 2019-06-11 NOTE — Telephone Encounter (Signed)
  Follow up Call-  Call back number 06/09/2019  Post procedure Call Back phone  # [phone]  Permission to leave phone message Yes  Some recent data might be hidden     Patient questions:  Do you have a fever, pain , or abdominal swelling? No. Pain Score  0 *  Have you tolerated food without any problems? Yes.    Have you been able to return to your normal activities? Yes.    Do you have any questions about your discharge instructions: Diet   No. Medications  No. Follow up visit  No.  Do you have questions or concerns about your Care? No.  Actions: * If pain score is 4 or above: No action needed, pain <4.  1. Have you developed a fever since your procedure? no  2.   Have you had an respiratory symptoms (SOB or cough) since your procedure? no  3.   Have you tested positive for COVID 19 since your procedure no  4.   Have you had any family members/close contacts diagnosed with the COVID 19 since your procedure?  no   If yes to any of these questions please route to Joylene John, RN and Alphonsa Gin, Therapist, sports.

## 2019-06-22 ENCOUNTER — Encounter: Payer: Self-pay | Admitting: Gastroenterology

## 2019-06-24 ENCOUNTER — Encounter: Payer: Self-pay | Admitting: Family Medicine

## 2019-06-24 ENCOUNTER — Ambulatory Visit: Payer: 59 | Admitting: Family Medicine

## 2019-06-24 ENCOUNTER — Other Ambulatory Visit: Payer: Self-pay

## 2019-06-24 VITALS — BP 122/80 | HR 80 | Temp 98.4°F | Wt 250.0 lb

## 2019-06-24 DIAGNOSIS — T63441A Toxic effect of venom of bees, accidental (unintentional), initial encounter: Secondary | ICD-10-CM | POA: Diagnosis not present

## 2019-06-24 DIAGNOSIS — T63461A Toxic effect of venom of wasps, accidental (unintentional), initial encounter: Secondary | ICD-10-CM | POA: Diagnosis not present

## 2019-06-24 MED ORDER — TRIAMCINOLONE ACETONIDE 0.1 % EX CREA: 1.0000 "application " | TOPICAL_CREAM | Freq: Two times a day (BID) | CUTANEOUS | 0 refills | Status: DC

## 2019-06-24 MED ORDER — PREDNISONE 10 MG (21) PO TBPK: ORAL_TABLET | Freq: Every day | ORAL | 0 refills | Status: DC

## 2019-06-24 MED ORDER — DOXYCYCLINE HYCLATE 100 MG PO TABS: 100.0000 mg | ORAL_TABLET | Freq: Two times a day (BID) | ORAL | 0 refills | Status: DC

## 2019-06-24 NOTE — Patient Instructions (Signed)
Take the oral steroid as discussed. You can also use the topical Kenalog steroid on your hand and wrist up to one week and then stop.   If you notice any worsening redness, swelling or if you were to start seeing pus, start the antibiotic.   Follow up as needed.

## 2019-06-24 NOTE — Progress Notes (Signed)
   Subjective:    Patient ID: Steven Fletcher, male    DOB: 05/17/1961, 58 y.o.   MRN: [id number]  HPI Chief Complaint  Patient presents with  . multibee sting    hand swelling and right wrist sting   States he was stung by wasps on his left hand and right wrist 2 days ago. States his left hand is swollen, hot, throbbing and seems to be getting worse. No pus or drainage.  Denies history of severe allergic reaction to bee stings.   Has used benadryl topical and hydrocortisone cream.   Denies fever, chills, N/V.   Reviewed allergies, medications, past medical, surgical, family, and social history.    Review of Systems Pertinent positives and negatives in the history of present illness.     Objective:   Physical Exam BP 122/80   Pulse 80   Temp 98.4 F (36.9 C) (Oral)   Wt 250 lb (113.4 kg)   BMI 33.91 kg/m   Alert and oriented and in no acute distress. Left hand with erythema, increased warmth and edema from MCP joints to approximately 2 inches above his left wrist. Palmar side not affected. 3 insect bites are visible on dorsum of left hand. Right wrist with 2 insect punctures but no erythema, edema or exudate visible. Left hand is neurovascularly intact. Normal ROM of both wrists.       Assessment & Plan:  Wasp sting, accidental or unintentional, initial encounter - Plan: triamcinolone cream (KENALOG) 0.1 %, doxycycline (VIBRA-TABS) 100 MG tablet, predniSONE (STERAPRED UNI-PAK 21 TAB) 10 MG (21) TBPK tablet   Allergic reaction to bee sting - Plan: triamcinolone cream (KENALOG) 0.1 %, predniSONE (STERAPRED UNI-PAK 21 TAB) 10 MG (21) TBPK tablet  Oral and topical steroids prescribed. Discussed potential side effects. He will use cool compresses and benadryl at bedtime if needed for itching.  Possible early cellulitis. Will cover him with antibiotic and he will start this if areas are worsening. Follow up as needed.

## 2019-09-08 ENCOUNTER — Other Ambulatory Visit: Payer: Self-pay | Admitting: Medical

## 2020-02-04 ENCOUNTER — Ambulatory Visit: Payer: 59

## 2020-02-04 ENCOUNTER — Ambulatory Visit: Payer: 59 | Attending: Internal Medicine

## 2020-02-04 DIAGNOSIS — Z23 Encounter for immunization: Secondary | ICD-10-CM | POA: Insufficient documentation

## 2020-02-04 NOTE — Progress Notes (Signed)
   Covid-19 Vaccination Clinic  Name:  Steven Fletcher    MRN: [id number] DOB: 07/04/1961  02/04/2020  [name] was observed post Covid-19 immunization for 15 minutes without incident. He was provided with Vaccine Information Sheet and instruction to access the V-Safe system.   [name] was instructed to call 911 with any severe reactions post vaccine: Marland Kitchen Difficulty breathing  . Swelling of face and throat  . A fast heartbeat  . A bad rash all over body  . Dizziness and weakness

## 2020-03-07 ENCOUNTER — Ambulatory Visit: Payer: 59

## 2020-03-09 ENCOUNTER — Ambulatory Visit: Payer: 59

## 2020-03-09 ENCOUNTER — Ambulatory Visit: Payer: 59 | Attending: Internal Medicine

## 2020-03-09 DIAGNOSIS — Z23 Encounter for immunization: Secondary | ICD-10-CM

## 2020-03-09 NOTE — Progress Notes (Signed)
   Covid-19 Vaccination Clinic  Name:  Steven Fletcher    MRN: [id number] DOB: 1961-01-14  03/09/2020  [name] was observed post Covid-19 immunization for 15 minutes without incident. He was provided with Vaccine Information Sheet and instruction to access the V-Safe system.   [name] was instructed to call 911 with any severe reactions post vaccine: Marland Kitchen Difficulty breathing  . Swelling of face and throat  . A fast heartbeat  . A bad rash all over body  . Dizziness and weakness   Immunizations Administered    Name Date Dose VIS Date Route   Pfizer COVID-19 Vaccine 03/09/2020  8:51 AM 0.3 mL 11/12/2019 Intramuscular   Manufacturer: [employer]   Lot: [id number]   [place]: [id number]

## 2020-05-15 ENCOUNTER — Other Ambulatory Visit: Payer: Self-pay | Admitting: Medical

## 2020-06-28 ENCOUNTER — Encounter: Payer: Self-pay | Admitting: Medical

## 2020-06-28 ENCOUNTER — Other Ambulatory Visit: Payer: Self-pay

## 2020-06-28 ENCOUNTER — Ambulatory Visit: Payer: 59 | Admitting: Medical

## 2020-06-28 VITALS — BP 110/80 | HR 85 | Ht 72.0 in | Wt 253.4 lb

## 2020-06-28 DIAGNOSIS — E782 Mixed hyperlipidemia: Secondary | ICD-10-CM

## 2020-06-28 DIAGNOSIS — Z23 Encounter for immunization: Secondary | ICD-10-CM | POA: Diagnosis not present

## 2020-06-28 DIAGNOSIS — Z8249 Family history of ischemic heart disease and other diseases of the circulatory system: Secondary | ICD-10-CM

## 2020-06-28 DIAGNOSIS — M722 Plantar fascial fibromatosis: Secondary | ICD-10-CM

## 2020-06-28 DIAGNOSIS — E291 Testicular hypofunction: Secondary | ICD-10-CM

## 2020-06-28 DIAGNOSIS — Z7185 Encounter for immunization safety counseling: Secondary | ICD-10-CM

## 2020-06-28 DIAGNOSIS — Z0000 Encounter for general adult medical examination without abnormal findings: Secondary | ICD-10-CM

## 2020-06-28 DIAGNOSIS — Z136 Encounter for screening for cardiovascular disorders: Secondary | ICD-10-CM

## 2020-06-28 DIAGNOSIS — M6208 Separation of muscle (nontraumatic), other site: Secondary | ICD-10-CM

## 2020-06-28 DIAGNOSIS — R7301 Impaired fasting glucose: Secondary | ICD-10-CM | POA: Diagnosis not present

## 2020-06-28 DIAGNOSIS — I10 Essential (primary) hypertension: Secondary | ICD-10-CM

## 2020-06-28 DIAGNOSIS — E669 Obesity, unspecified: Secondary | ICD-10-CM

## 2020-06-28 DIAGNOSIS — Z7189 Other specified counseling: Secondary | ICD-10-CM

## 2020-06-28 DIAGNOSIS — Z125 Encounter for screening for malignant neoplasm of prostate: Secondary | ICD-10-CM

## 2020-06-28 DIAGNOSIS — R7989 Other specified abnormal findings of blood chemistry: Secondary | ICD-10-CM

## 2020-06-28 NOTE — Progress Notes (Signed)
Subjective:   HPI  Steven Fletcher is a 59 y.o. male who presents for a complete physical.    Concerns: Plantar fascitis - 80% improved but still having some problems  Compliant with medications  Not currently exercising but trying to restart   Reviewed their medical, surgical, family, social, medication, and allergy history and updated chart as appropriate.  Past Medical History:  Diagnosis Date   Allergy    seasonal    Back pain    intermittent pain, sees Dr. Limmie Patricia, Chiropractic   Family history of premature CAD    Hypertension    Impaired fasting blood sugar    Mixed dyslipidemia 2005   hereditary   Obesity    Wears glasses     Past Surgical History:  Procedure Laterality Date   COLONOSCOPY  2010   [place]; normal, repeat 2020   COLONOSCOPY  06/2019   benign polyps, diverticulosis, Dr. 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  Works 12-15 hour days as of 12/2016.  Working [employer], Merchant navy officer.  42yo child.   No significant other.  06/2020   Social Determinants of Health   Financial Resource Strain:    Difficulty of Paying Living Expenses:   Food Insecurity:    Worried About Charity fundraiser in the Last Year:    Arboriculturist in the Last Year:   Transportation Needs:    Film/video editor (Medical):    Lack of Transportation (Non-Medical):   Physical Activity:    Days of Exercise per Week:    Minutes of Exercise per Session:   Stress:    Feeling of Stress :   Social Connections:    Frequency of Communication with Friends and Family:    Frequency of Social Gatherings with Friends and Family:    Attends Religious Services:    Active Member of Clubs or Organizations:    Attends Music therapist:    Marital Status:   Intimate Partner Violence:    Fear of Current or Ex-Partner:    Emotionally Abused:    Physically Abused:    Sexually Abused:     Family History  Problem Relation Age of Onset  Alzheimer's disease Mother    Hyperlipidemia Mother    Hypertension Father    Other Father        died of old age   63 Sister        breast   Hyperlipidemia Sister    Heart disease Brother 81       died of MI   Hyperlipidemia Brother    Diabetes Maternal Grandmother    Diabetes Paternal Grandmother    Stroke Neg Hx    Colon cancer Neg Hx    Colon polyps Neg Hx    Esophageal cancer Neg Hx    Ulcerative colitis Neg Hx    Stomach cancer Neg Hx    Rectal cancer Neg Hx      Current Outpatient Medications:    ASPIRIN LOW DOSE 81 MG EC tablet, TAKE 1 TABLET BY MOUTH  DAILY, Disp: 30 tablet, Rfl: 0   cetirizine (ZYRTEC) 10 MG chewable tablet, Chew 10 mg by mouth daily., Disp: , Rfl:    niacin 500 MG CR capsule, Take 1 capsule (500 mg total) by mouth at bedtime., Disp: 90 capsule, Rfl: 0   Olmesartan-amLODIPine-HCTZ 40-10-25 MG  TABS, TAKE 1 TABLET BY MOUTH  DAILY, Disp: 30 tablet, Rfl: 0   rosuvastatin (CRESTOR) 20 MG tablet, TAKE 1 TABLET BY MOUTH  DAILY, Disp: 30 tablet, Rfl: 0  No Known Allergies  Review of Systems Constitutional: -fever, -chills, -sweats, -unexpected weight change, -decreased appetite, -fatigue Allergy: -sneezing, -itching, -congestion Dermatology: -changing moles, --rash, +lumps ENT: -runny nose, -ear pain, -sore throat, -hoarseness, -sinus pain, -teeth pain, - ringing in ears, -hearing loss, -nosebleeds Cardiology: -chest pain, -palpitations, -swelling, -difficulty breathing when lying flat, -waking up short of breath Respiratory: -cough, -shortness of breath, -difficulty breathing with exercise or exertion, -wheezing, -coughing up blood Gastroenterology: -abdominal pain, -nausea, -vomiting, -diarrhea, -constipation, -blood in stool, -changes in bowel movement, -difficulty swallowing or eating Hematology: -bleeding, -bruising  Musculoskeletal: -joint aches, +muscle aches, -joint swelling, -back pain, -neck pain, -cramping, -changes in gait Ophthalmology: denies vision changes, eye redness, itching, discharge Urology: -burning with urination, -difficulty urinating, -blood in urine, -urinary frequency, -urgency, -incontinence Neurology: -headache, -weakness, -tingling, -numbness, -memory loss, -falls, -dizziness Psychology: -depressed mood, -agitation, -sleep problems     Objective:   Physical Exam  BP 110/80    Pulse 85    Ht 6' (1.829 m)    Wt (!) 253 lb 6.4 oz (114.9 kg)    SpO2 94%    BMI 34.37 kg/m    General appearance: alert, no distress, WD/WN, white male Skin: scattered macules,left middle finger and lateral edge of fingernail base with raised pinkish area, but no obvious foreign body, fluctuance, or warmth Neck: supple, no lymphadenopathy, no thyromegaly, no masses, normal ROM, no bruits Chest: non tender, normal shape and expansion Heart: RRR, normal S1, S2, no  murmurs Lungs: CTA bilaterally, no wheezes, rhonchi, or rales Abdomen: +bs, soft, bulge in upper abdomen c/w diastasis recti, non tender, non distended, no masses, no hepatomegaly, no splenomegaly, no bruits Back: non tender, normal ROM, no scoliosis Musculoskeletal: tender plantar fascia left foot, otherwise right thumb DIP with bony arthritic changes, right dorsal wrist laterally with small surgical scar, right medial and lateral elbow surgical scars,  anterior port surgical scars both knees, upper extremities non tender, no obvious deformity, normal ROM throughout, lower extremities non tender, no obvious deformity, normal ROM throughout Extremities: no edema, no cyanosis, no clubbing Pulses: 2+ symmetric, upper and lower extremities, normal cap refill Neurological:  alert, oriented x 3, CN2-12 intact, strength normal upper extremities and lower extremities, sensation normal throughout, DTRs 2+ throughout, no cerebellar signs, gait normal Psychiatric: normal affect, behavior normal, pleasant  GU: normal male external genitalia, circumcised, nontender, no masses, no hernia, no lymphadenopathy Rectal: anus normal tone, prostate mildly enlarged    Assessment and Plan :    Encounter Diagnoses  Name Primary?   Encounter for health maintenance examination in adult Yes   Essential hypertension    Impaired fasting blood sugar    Mixed dyslipidemia    Obesity with serious comorbidity, unspecified classification, unspecified obesity type    Vaccine counseling    Screening for prostate cancer    Diastasis recti    Hypogonadism in male    Family history of premature CAD    Low testosterone    Need for shingles vaccine    Screening for heart disease    Plantar fasciitis     Physical exam - discussed and counseled on healthy lifestyle, diet, exercise, preventative care, vaccinations, sick and well care, proper use of emergency dept and after hours care, and addressed their  concerns.    Health screening: See your eye doctor yearly for routine vision care. See your dentist yearly for routine dental care including hygiene visits twice yearly.  Cancer screening Colonoscopy:  Reviewed colonoscopy on file that is up to date  Discussed PSA, prostate exam, and prostate cancer screening risks/benefits.      Vaccinations: Advised yearly influenza vaccine Up to date on Td and Covid vaccine  Counseled on the Shingrix vaccine.  Vaccine information sheet given. Shingrix #1 vaccine given after consent obtained.   Return in 2 months for Shingrix #2.Counseled on the Shingrix vaccine.  Vaccine information sheet given. Shingrix #1 vaccine given after consent obtained.   Return in 2 months for Shingrix #2.   Acute issues discussed: none  Separate significant chronic issues discussed Hypertension-continue current medication  Hyperlipidemia-continue current medication  Obesity-work on efforts to lose weight through healthy diet and exercise  Plantar fasciitis/advised several night splints, continue supportive care that he is doing already, avoid going without shoes    Eon was seen today for annual exam.  Diagnoses and all orders for this visit:  Encounter for health maintenance examination in adult -     PSA -     Comprehensive metabolic panel -     CBC with Differential/Platelet -     Lipid panel -     Hemoglobin A1c  Essential hypertension -     Comprehensive metabolic panel -     Ambulatory referral to Cardiology  Impaired fasting blood sugar -     Hemoglobin A1c  Mixed dyslipidemia -     Lipid panel -     Ambulatory referral to Cardiology  Obesity with serious comorbidity, unspecified classification, unspecified obesity type  Vaccine counseling  Screening for prostate cancer -     PSA  Diastasis recti  Hypogonadism in male  Family history of premature CAD -     Ambulatory referral to Cardiology  Low testosterone  Need for shingles  vaccine  Screening for heart disease -     Ambulatory referral to Cardiology  Plantar fasciitis  Other orders -     Varicella-zoster vaccine IM (Shingrix)   F/u pending labs

## 2020-06-28 NOTE — Patient Instructions (Signed)
Preventative Care for Adults - Male    Thank you for coming in for your well visit today, and thank you for trusting [place] with your care!  If you had a good experience today, please complete the surveys sent by [facility] and consider a review online such as Google or Health Grades, refer [place] to a friend   Maintain regular health and wellness exams:  A routine yearly physical is a good way to check in with your primary care provider about your health and preventive screening. It is also an opportunity to share updates about your health and any concerns you have, and receive a thorough all-over exam.   Most health insurance companies pay for at least some preventative services.  Check with your health plan for specific coverages.  What preventative services do men need?  Adult men should have their weight and blood pressure checked regularly.   Men age 21 and older should have their cholesterol levels checked regularly.  Beginning at age 81 and continuing to age 57, men should be screened for colorectal cancer.  Certain people may need continued testing until age 37.  Updating vaccinations is part of preventative care.  Vaccinations help protect against diseases such as the flu.  Osteoporosis is a disease in which the bones lose minerals and strength as we age. Men ages 18 and over should discuss this with their caregivers  Lab tests are generally done as part of preventative care to screen for anemia and blood disorders, to screen for problems with the kidneys and liver, to screen for bladder problems, to check blood sugar, and to check your cholesterol level.  Preventative services generally include counseling about diet, exercise, avoiding tobacco, drugs, excessive alcohol consumption, and sexually transmitted infections.   Xrays and CT scans are not normally done as a preventative test, and most insurances do not pay for imaging for screening other than as discussed under cancer screens  below.   On the other hand, if you have certain medical concerns, imaging may be necessary as a diagnostic test.    Your Medical Team Your medical team starts with [place], your PCP or primary care provider.  Please use our services for your routine care such as physicals, screenings, immunizations, sick visits, and your first stop for general medical concerns.  You can call our number for after hours information for urgent questions that may need attention but cannot wait til the next business day.    Urgent care-urgent cares exist to provide care when your primary care office would typically be closed such as evenings or weekends.   Urgent care is for evaluation of urgent medical problems that do not necessarily require emergency department care, but cannot wait til the next business day when we are open.  Emergency department care-please reserve emergency department care for serious, urgent, possibly life-threatening medical problems.  This includes issues like possible stroke, heart attack, significant injury, mental health crisis, or other urgent need that requires immediate medical attention.     See your dentist office twice yearly for hygiene and cleaning visits.   Brush your teeth and floss your teeth daily.  See your eye doctor yearly for routine eye exam and screenings for glaucoma and retinal disease.   Current Health Care Team:  Dentist  Eye doctor  Dr. Harl Bowie, gastroenterology  Hanna Ra, Camelia Eng, PA-C here for primary care   Specific Concerns:   Encounter Diagnoses  Name Primary?  . Encounter for health maintenance examination in  adult Yes  . Essential hypertension   . Impaired fasting blood sugar   . Mixed dyslipidemia   . Obesity with serious comorbidity, unspecified classification, unspecified obesity type   . Vaccine counseling   . Screening for prostate cancer   . Diastasis recti   . Hypogonadism in male   . Family history of premature CAD   . Low  testosterone   . Need for shingles vaccine   . Screening for heart disease         Vaccines:  Stay up to date with your tetanus shots and other required immunizations. You should have a booster for tetanus every 10 years. Be sure to get your flu shot every year, since 5%-20% of the [place] population comes down with the flu. The flu vaccine changes each year, so being vaccinated once is not enough. Get your shot in the fall, before the flu season peaks.   Other vaccines to consider:  Pneumococcal vaccine to protect against certain types of pneumonia.  This is normally recommended for adults age 72 or older.  However, adults younger than 61 years old with certain underlying conditions such as diabetes, heart or lung disease should also receive the vaccine.  Shingles vaccine to protect against Varicella Zoster if you are older than age 20, or younger than 61 years old with certain underlying illness.  If you have not had the Shingrix vaccine, please call your insurer to inquire about coverage for the Shingrix vaccine given in 2 doses.   Some insurers cover this vaccine after age 30, some cover this after age 4.  If your insurer covers this, then call to schedule appointment to have this vaccine here  Hepatitis A vaccine to protect against a form of infection of the liver by a virus acquired from food.  Hepatitis B vaccine to protect against a form of infection of the liver by a virus acquired from blood or body fluids, particularly if you work in health care.  If you plan to travel internationally, check with your local health department for specific vaccination recommendations.  Human Papilloma Virus or HPV causes cancer of the cervix, and other infections that can be transmitted from person to person. There is a vaccine for HPV, and males should get immunized between the ages of 44 and 6. It requires a series of 3 shots.   Covid/Coronavirus - Please consider vaccination for your benefit and  to help prevent spread of Covid to those around you.      Are your vaccines up to date: Is your Td/Tdap vaccine up to date: yes. Is your Covid vaccine up to date: yes.  Return in 2 months for Shingrix #2    What should I know about Cancer screening? Many types of cancers can be detected early and may often be prevented. Lung Cancer  You should be screened every year for lung cancer if: ? You are a current smoker who has smoked for at least 30 years. ? You are a former smoker who has quit within the past 15 years.  Talk to your health care provider about your screening options, when you should start screening, and how often you should be screened.  Colorectal Cancer  Routine colorectal cancer screening usually begins at 61 years of age and should be repeated every 5-10 years until you are 61 years old. You may need to be screened more often if early forms of precancerous polyps or small growths are found. Your health care provider may  recommend screening at an earlier age if you have risk factors for colon cancer.  Your health care provider may recommend using home test kits to check for hidden blood in the stool.  A small camera at the end of a tube can be used to examine your colon (sigmoidoscopy or colonoscopy). This checks for the earliest forms of colorectal cancer.  Prostate and Testicular Cancer  Depending on your age and overall health, your health care provider may do certain tests to screen for prostate and testicular cancer.  Talk to your health care provider about any symptoms or concerns you have about testicular or prostate cancer.  Skin Cancer  Check your skin from head to toe regularly.  Tell your health care provider about any new moles or changes in moles, especially if: ? There is a change in a mole's size, shape, or color. ? You have a mole that is larger than a pencil eraser.  Always use sunscreen. Apply sunscreen liberally and repeat throughout the  day.  Protect yourself by wearing long sleeves, pants, a wide-brimmed hat, and sunglasses when outside.   Are you up to date on cancer screenings:  COLON CANCER SCREENING:  Up to date 2020  PROSTATE CANCER SCREENING:  Done today   [place]:  Healthy diet:  Eat a variety of foods, including fruit, vegetables, animal or vegetable protein, such as meat, fish, chicken, and eggs, or beans, lentils, tofu, and grains, such as rice.  Drink plenty of water daily.  Decrease saturated fat in the diet, avoid lots of red meat, processed foods, sweets, fast foods, and fried foods.  Exercise:  Aerobic exercise helps maintain good heart health. At least 30-40 minutes of moderate-intensity exercise is recommended. For example, a brisk walk that increases your heart rate and breathing. This should be done on most days of the week.   Find a type of exercise or a variety of exercises that you enjoy so that it becomes a part of your daily life.  Examples are running, walking, swimming, water aerobics, and biking.  For motivation and support, explore group exercise such as aerobic class, spin class, Zumba, Yoga,or  martial arts, etc.    Set exercise goals for yourself, such as a certain weight goal, walk or run in a race such as a 5k walk/run.  Speak to your primary care provider about exercise goals.  Your weight readings per our records: Wt Readings from Last 3 Encounters:  06/28/20 (!) 253 lb 6.4 oz (114.9 kg)  06/24/19 250 lb (113.4 kg)  06/09/19 235 lb (106.6 kg)    Body mass index is 34.37 kg/m.   Take some time to write out some health goals and info here.  Exercise  My current exercise reported today is: Not much  Goals for fitness or exercise going forward is:    Eating habits My current eating habits include:   Goals for eating habits:      Disease prevention:  If you smoke or chew tobacco, find out from your caregiver how to quit. It  can literally save your life, no matter how long you have been a tobacco user. If you do not use tobacco, never begin.   Maintain a healthy diet and normal weight. Increased weight leads to problems with blood pressure and diabetes.   The Body Mass Index or BMI is a way of measuring how much of your body is fat. Having a BMI above 27 increases the risk of heart  disease, diabetes, hypertension, stroke and other problems related to obesity. Your caregiver can help determine your BMI and based on it develop an exercise and dietary program to help you achieve or maintain this important measurement at a healthful level.  High blood pressure causes heart and blood vessel problems.  Persistent high blood pressure should be treated with medicine if weight loss and exercise do not work.  Your blood pressure readings per our records:     BP Readings from Last 3 Encounters:  06/28/20 110/80  06/24/19 122/80  06/09/19 105/70     Fat and cholesterol leaves deposits in your arteries that can block them. This causes heart disease and vessel disease elsewhere in your body.  If your cholesterol is found to be high, or if you have heart disease or certain other medical conditions, then you may need to have your cholesterol monitored frequently and be treated with medication.   Ask if you should have a cardiac stress test if your history suggests this. A stress test is a test done on a treadmill that looks for heart disease. This test can find disease prior to there being a problem.    Heart disease screening:  We are referring you for screening given risk factors   Osteoporosis is a disease in which the bones lose minerals and strength as we age. This can result in serious bone fractures. Risk of osteoporosis can be identified using a bone density scan. Men ages 24 and over should discuss this with their caregivers. Ask your caregiver whether you should be taking a calcium supplement and Vitamin D, to reduce  the rate of osteoporosis.   Avoid drinking alcohol in excess (more than two drinks per day).  Avoid use of street drugs. Do not share needles with anyone. Ask for professional help if you need assistance or instructions on stopping the use of alcohol, cigarettes, and/or drugs.  Brush your teeth twice a day with fluoride toothpaste, and floss once a day. Good oral hygiene prevents tooth decay and gum disease. The problems can be painful, unattractive, and can cause other health problems. Visit your dentist for a routine oral and dental check up and preventive care every 6-12 months.      Spiritual and Emotional Health Keeping a healthy spiritual life can help you better manage your physical health. Your spiritual life can help you to cope with any issues that may arise with your physical health.  Balance can keep [place] healthy and help [place] to recover.  If you are struggling with your spiritual health there are questions that you may want to ask yourself:  What makes me feel most complete? When do I feel most connected to the rest of the world? Where do I find the most inner strength? What am I doing when I feel whole?  Helpful tips: . Being in nature. Some people feel very connected and at peace when they are walking outdoors or are outside. Marland Kitchen Helping others. Some feel the largest sense of wellbeing when they are of service to others. Being of service can take on many forms. It can be doing volunteer work, being kind to strangers, or offering a hand to a friend in need. . Gratitude. Some people find they feel the most connected when they remain grateful. They may make lists of all the things they are grateful for or say a thank you out loud for all they have.    Emotional Health Are you in tune with your emotional  health?  Check out this link: http://www.bray.com/    Legal  Take the time to do a last will and testament, Advanced Directives including [place] and Living Will documents.  Don't leave your family with burdens that can be handled ahead of time.   Financial Health . Make sure you use a budget for your personal finances . Make sure you are insured against risks (health insurance, life insurance, auto insurance, etc) . Save more, spend less . Set financial goals . If you need help in this area, good resources include counseling through [employer] or other community resources, have a meeting with a Emergency planning/management officer, and a good resource is [employer] 10 reasons people come to the doctor's office:   (what is your "ounce of prevention")  Skin disorders; Osteoarthritis and joint disorders; Back problems; Cholesterol problems; Upper respiratory conditions, excluding asthma; Anxiety, depression, and bipolar disorder; Chronic neurologic disorders; High blood pressure; Headaches and migraines; and Diabetes.      Safety:  Use seatbelts 100% of the time, whether driving or as a passenger.  Use safety devices such as hearing protection if you work in environments with loud noise or significant background noise.  Use safety glasses when doing any work that could send debris in to the eyes.  Use a helmet if you ride a bike or motorcycle.  Use appropriate safety gear for contact sports.  Talk to your caregiver about gun safety.  Use sunscreen with a SPF (or skin protection factor) of 15 or greater.  Lighter skinned people are at a greater risk of skin cancer. Don't forget to also wear sunglasses in order to protect your eyes from too much damaging sunlight. Damaging sunlight can accelerate cataract formation.   Keep carbon monoxide and smoke detectors in your home functioning at all times. Change the batteries every 6 months or use a model that plugs into the wall.    Sexual activity: . Sex is a normal part of life and sexual activity can continue into older adulthood for many healthy people.    . If you are having erectile dysfunction issues, please follow up to discuss this further.   . If you are not in a monogamous relationship or have more than one partner, please practice safe sex.  Use condoms. Condoms are used for birth control and to help reduce the spread of sexually transmitted infections (or STIs).  Some of the STIs are gonorrhea (the clap), chlamydia, syphilis, trichomonas, herpes, HPV (human papilloma virus) and HIV (human immunodeficiency virus) which causes AIDS. The herpes, HIV and HPV are viral illnesses that have no cure. These can result in disability, cancer and death.   We are able to test for STIs here at our office.

## 2020-06-29 ENCOUNTER — Other Ambulatory Visit: Payer: Self-pay | Admitting: Medical

## 2020-06-29 LAB — PSA: Prostate Specific Ag, Serum: 2.3 ng/mL (ref 0.0–4.0)

## 2020-06-29 LAB — COMPREHENSIVE METABOLIC PANEL
ALT: 43 IU/L (ref 0–44)
AST: 42 IU/L — ABNORMAL HIGH (ref 0–40)
Albumin/Globulin Ratio: 1.8 (ref 1.2–2.2)
Albumin: 5 g/dL — ABNORMAL HIGH (ref 3.8–4.8)
Alkaline Phosphatase: 79 IU/L (ref 48–121)
BUN/Creatinine Ratio: 19 (ref 10–24)
BUN: 17 mg/dL (ref 8–27)
Bilirubin Total: 0.5 mg/dL (ref 0.0–1.2)
CO2: 22 mmol/L (ref 20–29)
Calcium: 10 mg/dL (ref 8.6–10.2)
Chloride: 98 mmol/L (ref 96–106)
Creatinine, Ser: 0.9 mg/dL (ref 0.76–1.27)
GFR calc Af Amer: 106 mL/min/{1.73_m2} (ref >59)
GFR calc non Af Amer: 92 mL/min/{1.73_m2} (ref >59)
Globulin, Total: 2.8 g/dL (ref 1.5–4.5)
Glucose: 106 mg/dL — ABNORMAL HIGH (ref 65–99)
Potassium: 4.4 mmol/L (ref 3.5–5.2)
Sodium: 135 mmol/L (ref 134–144)
Total Protein: 7.8 g/dL (ref 6.0–8.5)

## 2020-06-29 LAB — CBC WITH DIFFERENTIAL/PLATELET
Basophils Absolute: 0.1 10*3/uL (ref 0.0–0.2)
Basos: 1 %
EOS (ABSOLUTE): 0.1 10*3/uL (ref 0.0–0.4)
Eos: 1 %
Hematocrit: 44.1 % (ref 37.5–51.0)
Hemoglobin: 14.4 g/dL (ref 13.0–17.7)
Immature Grans (Abs): 0 10*3/uL (ref 0.0–0.1)
Immature Granulocytes: 0 %
Lymphocytes Absolute: 2.3 10*3/uL (ref 0.7–3.1)
Lymphs: 30 %
MCH: 28.6 pg (ref 26.6–33.0)
MCHC: 32.7 g/dL (ref 31.5–35.7)
MCV: 88 fL (ref 79–97)
Monocytes Absolute: 0.5 10*3/uL (ref 0.1–0.9)
Monocytes: 6 %
Neutrophils Absolute: 4.7 10*3/uL (ref 1.4–7.0)
Neutrophils: 62 %
Platelets: 248 10*3/uL (ref 150–450)
RBC: 5.03 x10E6/uL (ref 4.14–5.80)
RDW: 13.6 % (ref 11.6–15.4)
WBC: 7.7 10*3/uL (ref 3.4–10.8)

## 2020-06-29 LAB — HEMOGLOBIN A1C
Est. average glucose Bld gHb Est-mCnc: 134 mg/dL
Hgb A1c MFr Bld: 6.3 % — ABNORMAL HIGH (ref 4.8–5.6)

## 2020-06-29 LAB — LIPID PANEL
Chol/HDL Ratio: 5.8 ratio — ABNORMAL HIGH (ref 0.0–5.0)
Cholesterol, Total: 179 mg/dL (ref 100–199)
HDL: 31 mg/dL — ABNORMAL LOW (ref >39)
LDL Chol Calc (NIH): 85 mg/dL (ref 0–99)
Triglycerides: 384 mg/dL — ABNORMAL HIGH (ref 0–149)
VLDL Cholesterol Cal: 63 mg/dL — ABNORMAL HIGH (ref 5–40)

## 2020-06-29 MED ORDER — OLMESARTAN-AMLODIPINE-HCTZ 40-10-25 MG PO TABS: 1.0000 | ORAL_TABLET | Freq: Every day | ORAL | 3 refills | Status: DC

## 2020-06-29 MED ORDER — ROSUVASTATIN CALCIUM 20 MG PO TABS: 20.0000 mg | ORAL_TABLET | Freq: Every day | ORAL | 3 refills | Status: DC

## 2020-06-29 MED ORDER — ASPIRIN 81 MG PO TBEC: 81.0000 mg | DELAYED_RELEASE_TABLET | Freq: Every day | ORAL | 3 refills | Status: DC

## 2020-07-03 ENCOUNTER — Other Ambulatory Visit: Payer: Self-pay | Admitting: Medical

## 2020-07-03 ENCOUNTER — Telehealth: Payer: Self-pay

## 2020-07-03 DIAGNOSIS — L989 Disorder of the skin and subcutaneous tissue, unspecified: Secondary | ICD-10-CM

## 2020-07-03 MED ORDER — OMEGA-3-ACID ETHYL ESTERS 1 G PO CAPS
1.0000 g | ORAL_CAPSULE | Freq: Two times a day (BID) | ORAL | 3 refills | Status: DC
Start: 2020-07-03 — End: 2021-05-30

## 2020-07-03 NOTE — Telephone Encounter (Signed)
Patient sent a mychart message asking if the fish oil has been sent to the pharmacy. Does not appear to be sent, please send. Also, patient would like a referral to see dermatology for a spot near his shoulder. Please advise.

## 2020-07-03 NOTE — Telephone Encounter (Signed)
I sent the [facility] prescription fish oil today.  I was waiting on his agreement to that.  This would be in place of the niacin as the niacin does not appear to be doing anything.  The rest of the medicines would be ongoing  Please refer to dermatology

## 2020-07-04 NOTE — Telephone Encounter (Signed)
Referral to dermatology has been sent

## 2020-07-26 ENCOUNTER — Encounter: Payer: Self-pay | Admitting: Cardiology

## 2020-07-26 ENCOUNTER — Other Ambulatory Visit: Payer: Self-pay

## 2020-07-26 ENCOUNTER — Ambulatory Visit: Payer: 59 | Admitting: Cardiology

## 2020-07-26 VITALS — BP 127/81 | HR 84 | Resp 17 | Ht 72.0 in | Wt 257.0 lb

## 2020-07-26 DIAGNOSIS — E785 Hyperlipidemia, unspecified: Secondary | ICD-10-CM

## 2020-07-26 DIAGNOSIS — Z8249 Family history of ischemic heart disease and other diseases of the circulatory system: Secondary | ICD-10-CM

## 2020-07-26 DIAGNOSIS — I10 Essential (primary) hypertension: Secondary | ICD-10-CM

## 2020-07-26 DIAGNOSIS — E8881 Metabolic syndrome: Secondary | ICD-10-CM

## 2020-07-26 NOTE — Progress Notes (Signed)
Patient referred by Carlena Hurl, PA-C for screening for CAD  Subjective:   Steven Fletcher, male    DOB: [birth year], 59 y.o.   MRN: [id number]   Chief Complaint  Patient presents with   Hypertension   Hyperlipidemia   family history of premature CAD   New Patient (Initial Visit)     HPI  59 y/o Caucasian male with hypertension, hyperlipidemia, prediabetes, obesity, family h/o premature CAD  Patient works for [employer] and [employer]. He denies chest pain, shortness of breath, palpitations, leg edema, orthopnea, PND, TIA/syncope. He has not been as active physically through the pandemic. His lost his brother at 48 due to fatal MI. He has been on statin fo a long time, but continues to have elevaed TG.    Past Medical History:  Diagnosis Date   Allergy    seasonal    Back pain    intermittent pain, sees Dr. Limmie Patricia, Chiropractic   Family history of premature CAD    Hypertension    Impaired fasting blood sugar    Mixed dyslipidemia 2005   hereditary   Obesity    Wears glasses      Past Surgical History:  Procedure Laterality Date   COLONOSCOPY  2010   [place]; normal, repeat 2020   COLONOSCOPY  06/2019   benign polyps, diverticulosis, Dr. Harl Bowie, repeat 10 years   elbow Right    arthroscopic for cartilege 1980, 1984, [place]   move nerve   KNEE CARTILAGE SURGERY Bilateral 2003, 2005     x 2, R 2005, L 2003   TONSILLECTOMY     WISDOM TOOTH EXTRACTION     wrist sugery Right 2000    bone spur     Social History   Tobacco Use  Smoking Status Never Smoker  Smokeless Tobacco Never Used    Social History   Substance and Sexual Activity  Alcohol Use Yes   Alcohol/week: 3.0 standard drinks   Types: 3 Cans of beer per week   Comment: occasional     Family History  Problem Relation Age of Onset   Alzheimer's disease Mother    Hyperlipidemia Mother    Hypertension Father    Other Father         died of old age   70 Sister        breast   Hyperlipidemia Sister    Heart disease Brother 1       died of MI   Hyperlipidemia Brother    Diabetes Maternal Grandmother    Diabetes Paternal Grandmother    Stroke Neg Hx    Colon cancer Neg Hx    Colon polyps Neg Hx    Esophageal cancer Neg Hx    Ulcerative colitis Neg Hx    Stomach cancer Neg Hx    Rectal cancer Neg Hx      Current Outpatient Medications on File Prior to Visit  Medication Sig Dispense Refill   aspirin (ASPIRIN LOW DOSE) 81 MG EC tablet Take 1 tablet (81 mg total) by mouth daily. Swallow whole. 90 tablet 3   cetirizine (ZYRTEC) 10 MG chewable tablet Chew 10 mg by mouth daily.     Olmesartan-amLODIPine-HCTZ 40-10-25 MG TABS Take 1 tablet by mouth daily. 90 tablet 3   omega-3 acid ethyl esters (LOVAZA) 1 g capsule Take 1 capsule (1 g total) by mouth 2 (two) times daily. 360 capsule 3   rosuvastatin (CRESTOR) 20 MG tablet  Take 1 tablet (20 mg total) by mouth daily. 90 tablet 3   No current facility-administered medications on file prior to visit.    Cardiovascular and other pertinent studies:  EKG 07/26/2020: Sinus rhythm 87 bpm Normal EKG   Recent labs: 06/28/2020: Glucose 106, BUN/Cr 17/0.90. EGFR 92. Na/K 135/4.4. AST 42. Rest of the CMP normal H/H 14/44. MCV 88. Platelets 248 HbA1C 6.3% Chol 179, TG 384, HDL 31, LDL 85    Review of Systems  Cardiovascular: Negative for chest pain, dyspnea on exertion, leg swelling, palpitations and syncope.         Vitals:   07/26/20 1500  BP: 127/81  Pulse: 84  Resp: 17  SpO2: 98%     Body mass index is 34.86 kg/m. Filed Weights   07/26/20 1500  Weight: 257 lb (116.6 kg)     Objective:   Physical Exam Vitals and nursing note reviewed.  Constitutional:      General: He is not in acute distress. Neck:     Vascular: No JVD.  Cardiovascular:     Rate and Rhythm: Normal rate and regular rhythm.     Heart sounds: Normal  heart sounds. No murmur heard.   Pulmonary:     Effort: Pulmonary effort is normal.     Breath sounds: Normal breath sounds. No wheezing or rales.            Assessment & Recommendations:   59 y/o Caucasian male with hypertension, hyperlipidemia, prediabetes, obesity, family h/o premature CAD  Cardiac risk stratification: Metabolic syndrome with family h/o early CAD. Recommend calcium score scan for risk stratification. Continue Aspirin, statin for now. If he has CAD, will add Vascepa. Will repeat fasting lipid panel in 6 months.   Hypertension: Well controlled  Discussed heart healthy diet and lifestyle modification.     Thank you for referring the patient to [place]. Please feel free to contact with any questions.   Nigel Mormon, MD Pager: [phone] Office: [phone]

## 2020-07-27 ENCOUNTER — Encounter: Payer: Self-pay | Admitting: Cardiology

## 2020-07-27 DIAGNOSIS — E8881 Metabolic syndrome: Secondary | ICD-10-CM | POA: Insufficient documentation

## 2020-08-04 ENCOUNTER — Other Ambulatory Visit: Payer: Self-pay | Admitting: Cardiology

## 2020-08-04 DIAGNOSIS — I2510 Atherosclerotic heart disease of native coronary artery without angina pectoris: Secondary | ICD-10-CM

## 2020-08-04 DIAGNOSIS — R931 Abnormal findings on diagnostic imaging of heart and coronary circulation: Secondary | ICD-10-CM

## 2020-08-04 NOTE — Progress Notes (Signed)
Staff, please arrange COVID test-->Ex nuc F/u after the test

## 2020-08-08 NOTE — Progress Notes (Signed)
Has the covid test been scheduled yet please advise  Sch/rma

## 2020-08-08 NOTE — Progress Notes (Signed)
From front desk staff

## 2020-08-17 ENCOUNTER — Other Ambulatory Visit (HOSPITAL_COMMUNITY)
Admission: RE | Admit: 2020-08-17 | Discharge: 2020-08-17 | Disposition: A | Discharge status: Home / Self Care | Payer: 59 | Source: Ambulatory Visit | Attending: Cardiology | Admitting: Cardiology

## 2020-08-17 DIAGNOSIS — Z01812 Encounter for preprocedural laboratory examination: Secondary | ICD-10-CM | POA: Insufficient documentation

## 2020-08-17 DIAGNOSIS — Z20822 Contact with and (suspected) exposure to covid-19: Secondary | ICD-10-CM | POA: Insufficient documentation

## 2020-08-17 LAB — SARS CORONAVIRUS 2 (TAT 6-24 HRS): SARS Coronavirus 2: NEGATIVE

## 2020-08-18 ENCOUNTER — Other Ambulatory Visit (HOSPITAL_COMMUNITY): Payer: Self-pay

## 2020-08-21 ENCOUNTER — Ambulatory Visit: Payer: 59

## 2020-08-21 ENCOUNTER — Encounter: Payer: Self-pay | Admitting: Cardiology

## 2020-08-21 ENCOUNTER — Other Ambulatory Visit: Payer: Self-pay

## 2020-08-21 DIAGNOSIS — R931 Abnormal findings on diagnostic imaging of heart and coronary circulation: Secondary | ICD-10-CM

## 2020-08-21 DIAGNOSIS — I2510 Atherosclerotic heart disease of native coronary artery without angina pectoris: Secondary | ICD-10-CM

## 2020-08-21 HISTORY — DX: Abnormal findings on diagnostic imaging of heart and coronary circulation: R93.1

## 2020-08-29 ENCOUNTER — Other Ambulatory Visit: Payer: Self-pay

## 2020-08-29 ENCOUNTER — Other Ambulatory Visit (INDEPENDENT_AMBULATORY_CARE_PROVIDER_SITE_OTHER): Payer: 59

## 2020-08-29 DIAGNOSIS — Z23 Encounter for immunization: Secondary | ICD-10-CM | POA: Diagnosis not present

## 2020-11-13 ENCOUNTER — Encounter: Payer: Self-pay | Admitting: Medical

## 2020-11-13 ENCOUNTER — Telehealth: Payer: 59 | Admitting: Medical

## 2020-11-13 ENCOUNTER — Other Ambulatory Visit: Payer: Self-pay

## 2020-11-13 VITALS — Temp 98.3°F | Ht 72.0 in | Wt 250.0 lb

## 2020-11-13 DIAGNOSIS — R059 Cough, unspecified: Secondary | ICD-10-CM

## 2020-11-13 DIAGNOSIS — J069 Acute upper respiratory infection, unspecified: Secondary | ICD-10-CM | POA: Diagnosis not present

## 2020-11-13 MED ORDER — BENZONATATE 200 MG PO CAPS: 200.0000 mg | ORAL_CAPSULE | Freq: Three times a day (TID) | ORAL | 0 refills | Status: DC | PRN

## 2020-11-13 NOTE — Progress Notes (Signed)
Subjective:     Patient ID: Steven Fletcher, male   DOB: 1961-09-28, 59 y.o.   MRN: [id number]  This visit type was conducted due to national recommendations for restrictions regarding the COVID-19 Pandemic (e.g. social distancing) in an effort to limit this patient's exposure and mitigate transmission in our community.  Due to their co-morbid illnesses, this patient is at least at moderate risk for complications without adequate follow up.  This format is felt to be most appropriate for this patient at this time.    Documentation for virtual audio and video telecommunications through [facility] encounter:  The patient was located at home. The provider was located in the office. The patient did consent to this visit and is aware of possible charges through their insurance for this visit.  The other persons participating in this telemedicine service were none. Time spent on call was 20 minutes and in review of previous records 20 minutes total.  This virtual service is not related to other E/M service within previous 7 days.   HPI Chief Complaint  Patient presents with  . Cough    Cough with headache, sore throat and runny nose x3 days    Virtual consult for cough.  Started coughing 3 days ago.  Had covid test Friday per employer requirement.  Started feeling worse over weekend.   Covid test was negative.   Over weekend has gotten worse.   Has worse cough, worse drainage, sore throat, headache.   No loss of smell or taste.   No NVD.   No wheezing, but maybe a little SOB with coughing spell.  No sick contacts.   No body aches or chills.   Feels like a bad cold.  Having some sinus congestion.  Using mucinex fast max.  No other aggravating or relieving factors. No other complaint.  Past Medical History:  Diagnosis Date  . Agatston coronary artery calcium score greater than 400 08/21/2020  . Allergy    seasonal   . Back pain    intermittent pain, sees Dr. Limmie Patricia, Chiropractic  . Family history  of premature CAD   . Hypertension   . Impaired fasting blood sugar   . Mixed dyslipidemia 2005   hereditary  . Obesity   . Wears glasses     Review of Systems As in subjective    Objective:   Physical Exam Due to coronavirus pandemic stay at home measures, patient visit was virtual and they were not examined in person.   Temp 98.3 F (36.8 C)   Ht 6' (1.829 m)   Wt 250 lb (113.4 kg)   BMI 33.91 kg/m  Gen: wd, wn, nad No cough during interview Looks relatively well appearing     Assessment:     Encounter Diagnoses  Name Primary?  . Cough Yes  . Upper respiratory tract infection, unspecified type        Plan:     Symptoms suggestive of viral upper respiratory infection or common cold.  He had a negative Covid test over the weekend.  He will continue with supportive care, rest, hydration, salt water gargles, the over-the-counter Mucinex he is using.  Advised nasal saline flush, Chloraseptic spray, Tylenol as needed.  Prescription below for additional help with cough is keeping him up at night.   We discussed usual timeframe for symptoms to resolve.  If worse or not improving over the next few days then call or recheck.  He voiced understanding and agreement of plan  Timoty was  seen today for cough.  Diagnoses and all orders for this visit:  Cough  Upper respiratory tract infection, unspecified type  Other orders -     benzonatate (TESSALON) 200 MG capsule; Take 1 capsule (200 mg total) by mouth 3 (three) times daily as needed for cough.  f/u prn

## 2021-01-29 ENCOUNTER — Ambulatory Visit: Payer: 59 | Admitting: Cardiology

## 2021-02-09 ENCOUNTER — Telehealth: Payer: Self-pay | Admitting: Medical

## 2021-02-09 NOTE — Telephone Encounter (Signed)
Got pt scheduled for Monday

## 2021-02-09 NOTE — Telephone Encounter (Signed)
See his message and offer in person appt to eval the knee

## 2021-02-12 ENCOUNTER — Ambulatory Visit
Admission: RE | Admit: 2021-02-12 | Discharge: 2021-02-12 | Disposition: A | Discharge status: Home / Self Care | Payer: 59 | Source: Ambulatory Visit | Attending: Medical | Admitting: Medical

## 2021-02-12 ENCOUNTER — Other Ambulatory Visit: Payer: Self-pay

## 2021-02-12 ENCOUNTER — Ambulatory Visit: Payer: 59 | Admitting: Medical

## 2021-02-12 ENCOUNTER — Encounter: Payer: Self-pay | Admitting: Medical

## 2021-02-12 VITALS — BP 118/80 | HR 88 | Ht 72.0 in | Wt 260.8 lb

## 2021-02-12 DIAGNOSIS — M25562 Pain in left knee: Secondary | ICD-10-CM

## 2021-02-12 NOTE — Progress Notes (Signed)
Subjective:  Steven Fletcher is a 60 y.o. male who presents for Chief Complaint  Patient presents with  . Knee Pain    Pt present today for left pain. Denies injury. Pain started x2 weeks ago      Here for left knee pain x 2 weeks.   Feels like a needle poking him in the knee at times.  He can be walking along when the pain does him out of the blue.  Worse with trying to bend down in a squatted position.  No swelling.  No injury.  No recent strenuous activity.  Doing some exercise for walking.  Ice seemed to make his knee more stiff.  Heat seems to help.  Using some ibuprofen but it does not seem to help.  No other aggravating or relieving factors.    No other c/o.  The following portions of the patient's history were reviewed and updated as appropriate: allergies, current medications, past family history, past medical history, past social history, past surgical history and problem list.  ROS Otherwise as in subjective above  Objective: BP 118/80   Pulse 88   Ht 6' (1.829 m)   Wt 260 lb 12.8 oz (118.3 kg)   SpO2 95%   BMI 35.37 kg/m   General appearance: alert, no distress, well developed, well nourished Left knee without swelling or obvious deformity.  No erythema or bruising. Tender over the anterior medial joint line to palpation, worse if knee flexed and hip in external range of motion, otherwise knee nontender to palpation, no obvious laxity, negative special test Legs neurovascularly intact    Assessment: Encounter Diagnosis  Name Primary?  . Acute pain of left knee Yes     Plan: I suspect a bone spur versus degenerative changes it could be putting stress such as bone against bone with bending down or knee flexion and hip external rotation.  No obvious laxity of joints.  Has Aleve 1 to 2 tablets twice a day short-term as needed, can continue ice or heat therapy whichever feels best.  Can try topical Aspercreme or capsaicin cream instead of oral Aleve.  We will send for  x-ray.  Steven Fletcher was seen today for knee pain.  Diagnoses and all orders for this visit:  Acute pain of left knee -     DG Knee Complete 4 Views Left; Future    Follow up: pending xray

## 2021-02-12 NOTE — Patient Instructions (Signed)
Please go to [facility] for your left knee xray.   Their hours are 8am - 4:30 pm Monday - Friday.  Take your insurance card with you.  [facility] [phone]  [address], [place], [place] [address]  [address] [place], [place] [address]

## 2021-02-13 ENCOUNTER — Other Ambulatory Visit: Payer: Self-pay | Admitting: Medical

## 2021-02-13 ENCOUNTER — Other Ambulatory Visit: Payer: Self-pay

## 2021-02-13 DIAGNOSIS — M25562 Pain in left knee: Secondary | ICD-10-CM

## 2021-02-13 MED ORDER — NAPROXEN 500 MG PO TABS: 500.0000 mg | ORAL_TABLET | Freq: Two times a day (BID) | ORAL | 0 refills | Status: DC

## 2021-02-13 NOTE — Progress Notes (Signed)
Patient is being referred to [facility].

## 2021-02-21 ENCOUNTER — Ambulatory Visit: Payer: 59 | Admitting: Physician Assistant

## 2021-02-21 ENCOUNTER — Encounter: Payer: Self-pay | Admitting: Physician Assistant

## 2021-02-21 VITALS — Ht 72.0 in | Wt 260.8 lb

## 2021-02-21 DIAGNOSIS — M25562 Pain in left knee: Secondary | ICD-10-CM

## 2021-02-21 MED ORDER — LIDOCAINE HCL 1 % IJ SOLN
5.0000 mL | INTRAMUSCULAR | Status: AC | PRN
  Administered 2021-02-21: 5 mL

## 2021-02-21 MED ORDER — METHYLPREDNISOLONE ACETATE 40 MG/ML IJ SUSP
40.0000 mg | INTRAMUSCULAR | Status: AC | PRN
  Administered 2021-02-21: 40 mg via INTRA_ARTICULAR

## 2021-02-21 NOTE — Progress Notes (Signed)
Office Visit Note   Patient: Steven Fletcher           Date of Birth: [birth year]           MRN: [id number] Visit Date: 02/21/2021              Requested by: Carlena Hurl, PA-C [address] [place],  [place] [address] PCP: Carlena Hurl, PA-C   Assessment & Plan: Visit Diagnoses:  1. Acute pain of left knee     Plan: Patient was agreeable with cortisone and attempted aspiration of the left knee today.  This was a dry tap.  Patient tolerated injection well.  We will see him back in 2 weeks see what type of response he had to the injection needs to be mindful of any mechanical symptoms which are reviewed with him today.  He will work on Forensic scientist.  Knee friendly exercises were discussed.  Follow-Up Instructions: Return in about 2 weeks (around 03/07/2021).   Orders:  No orders of the defined types were placed in this encounter.  No orders of the defined types were placed in this encounter.     Procedures: Large Joint Inj: L knee on 02/21/2021 4:54 PM Indications: pain Details: 22 G 1.5 in needle, superolateral approach  Arthrogram: No  Medications: 40 mg methylPREDNISolone acetate 40 MG/ML; 5 mL lidocaine 1 % Outcome: tolerated well, no immediate complications Procedure, treatment alternatives, risks and benefits explained, specific risks discussed. Consent was given by the patient. Immediately prior to procedure a time out was called to verify the correct patient, procedure, equipment, support staff and site/side marked as required. Patient was prepped and draped in the usual sterile fashion.       Clinical Data: No additional findings.   Subjective: No chief complaint on file.   HPI Patient is a 60 year old male were seen for the first time for left knee pain.  Pain began about 3 weeks ago.  No known injury.  Pain is mostly medial aspect knee.  History of knee arthroscopy some 20 years ago for meniscal tear.  Knee is done well until recently.  He  denies any mechanical symptoms or swelling in the knee.  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  Has been taking some naproxen and it helps some.  Pain increases with ambulation and stairs.  He is nondiabetic. Radiographs dated 02/12/2021 4 views of the left knee reviewed.  These show mild to moderate medial compartmental narrowing mild patellofemoral changes.  Lateral compartment well-preserved.  No acute fractures no bony abnormalities otherwise.  Review of Systems Denies any fevers or chills.   Objective: Vital Signs: Ht 6' (1.829 m)   Wt 260 lb 12.8 oz (118.3 kg)   BMI 35.37 kg/m   Physical Exam Constitutional:      Appearance: He is not ill-appearing or diaphoretic.  Pulmonary:     Effort: Pulmonary effort is normal.  Neurological:     Mental Status: He is alert and oriented to person, place, and time.  Psychiatric:        Mood and Affect: Mood normal.     Ortho Exam Bilateral knees good range of motion both knees.  No instability valgus varus stressing.  Slight edema plus minus effusion left knee.  No abnormal warmth or erythema about the knees.  McMurray's left knee positive.  Patellofemoral crepitus bilaterally. Specialty Comments:  No specialty comments available.  Imaging: No results found.  PMFS History: Patient Active Problem List   Diagnosis Date Noted  . Agatston coronary artery calcium score greater than 400 08/21/2020  . Metabolic syndrome 00/86/7619  . Screening for prostate cancer 06/28/2020  . Need for shingles vaccine 06/28/2020  . Screening for heart disease 06/28/2020  . Plantar fasciitis 06/28/2020  . Diastasis recti 12/04/2016  . Deviation of finger of left hand 12/04/2016  . Screen for colon cancer 12/04/2016  . Vaccine counseling 12/04/2016  . Ganglion cyst 07/08/2016  . Low testosterone 07/04/2016  . Hypogonadism in male 07/04/2016  . Encounter for health maintenance examination in adult 07/04/2016  .  Essential hypertension 06/07/2015  . Dyslipidemia 11/10/2014  . Impaired fasting blood sugar 11/10/2014  . Obesity 11/10/2014  . Family history of premature CAD 11/10/2014   Past Medical History:  Diagnosis Date  . Agatston coronary artery calcium score greater than 400 08/21/2020  . Allergy    seasonal   . Back pain    intermittent pain, sees Dr. Limmie Patricia, Chiropractic  . Family history of premature CAD   . Hypertension   . Impaired fasting blood sugar   . Mixed dyslipidemia 2005   hereditary  . Obesity   . Wears glasses     Family History  Problem Relation Age of Onset  . Alzheimer's disease Mother   . Hyperlipidemia Mother   . Hypertension Father   . Other Father        died of old age  . Cancer Sister        breast  . Hyperlipidemia Sister   . Heart disease Brother 64       died of MI  . Hyperlipidemia Brother   . Diabetes Maternal Grandmother   . Diabetes Paternal Grandmother   . Stroke Neg Hx   . Colon cancer Neg Hx   . Colon polyps Neg Hx   . Esophageal cancer Neg Hx   . Ulcerative colitis Neg Hx   . Stomach cancer Neg Hx   . Rectal cancer Neg Hx     Past Surgical History:  Procedure Laterality Date  . COLONOSCOPY  2010   [place]; normal, repeat 2020  . COLONOSCOPY  06/2019   benign polyps, diverticulosis, Dr. Harl Bowie, repeat 10 years  . elbow Right    arthroscopic for cartilege 1980, 1984, 1989  . ELBOW SURGERY Right 1990   move nerve  . KNEE CARTILAGE SURGERY Bilateral 2003, 2005     x 2, R 2005, L 2003  . TONSILLECTOMY    . WISDOM TOOTH EXTRACTION    . wrist sugery Right 2000    bone spur   Social History   Occupational History  . 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 Smoking status: Never Smoker  . Smokeless tobacco: Never Used  Vaping Use  . Vaping Use: Never used  Substance and Sexual Activity  . Alcohol use: Yes    Alcohol/week: 3.0 standard drinks    Types: 3 Cans of  beer per week    Comment: occasional  . Drug use: No  . Sexual activity: Not Currently

## 2021-02-23 LAB — LIPID PANEL
Chol/HDL Ratio: 4.8 ratio (ref 0.0–5.0)
Cholesterol, Total: 186 mg/dL (ref 100–199)
HDL: 39 mg/dL — ABNORMAL LOW (ref >39)
LDL Chol Calc (NIH): 110 mg/dL — ABNORMAL HIGH (ref 0–99)
Triglycerides: 215 mg/dL — ABNORMAL HIGH (ref 0–149)
VLDL Cholesterol Cal: 37 mg/dL (ref 5–40)

## 2021-02-23 LAB — LDL CHOLESTEROL, DIRECT: LDL Direct: 97 mg/dL (ref 0–99)

## 2021-02-25 DIAGNOSIS — I2510 Atherosclerotic heart disease of native coronary artery without angina pectoris: Secondary | ICD-10-CM | POA: Insufficient documentation

## 2021-02-25 NOTE — Progress Notes (Incomplete)
Patient referred by Carlena Hurl, PA-C for screening for CAD  Subjective:   Steven Fletcher, male    DOB: 27-Nov-1961, 60 y.o.   MRN: [id number]   No chief complaint on file.    HPI  60 y/o Caucasian male with hypertension, hyperlipidemia, prediabetes, obesity, family h/o premature CAD  Patient works for [employer]. He denies chest pain, shortness of breath, palpitations, leg edema, orthopnea, PND, TIA/syncope. He has not been as active physically through the pandemic. His lost his brother at 53 due to fatal MI. He has been on statin fo a long time, but continues to have elevaed TG.    Past Medical History:  Diagnosis Date  . Agatston coronary artery calcium score greater than 400 08/21/2020  . Allergy    seasonal   . Back pain    intermittent pain, sees Dr. Limmie Patricia, Chiropractic  . Family history of premature CAD   . Hypertension   . Impaired fasting blood sugar   . Mixed dyslipidemia 2005   hereditary  . Obesity   . Wears glasses      Past Surgical History:  Procedure Laterality Date  . COLONOSCOPY  2010   [place]; normal, repeat 2020  . COLONOSCOPY  06/2019   benign polyps, diverticulosis, Dr. Harl Bowie, repeat 10 years  . elbow Right    arthroscopic for cartilege 1980, 1984, 1989  . ELBOW SURGERY Right 1990   move nerve  . KNEE CARTILAGE SURGERY Bilateral 2003, 2005     x 2, R 2005, L 2003  . TONSILLECTOMY    . WISDOM TOOTH EXTRACTION    . wrist sugery Right 2000    bone spur     Social History   Tobacco Use  Smoking Status Never Smoker  Smokeless Tobacco Never Used    Social History   Substance and Sexual Activity  Alcohol Use Yes  . Alcohol/week: 3.0 standard drinks  . Types: 3 Cans of beer per week   Comment: occasional     Family History  Problem Relation Age of Onset  . Alzheimer's disease Mother   . Hyperlipidemia Mother   . Hypertension Father   . Other Father        died of old age  . Cancer Sister         breast  . Hyperlipidemia Sister   . Heart disease Brother 63       died of MI  . Hyperlipidemia Brother   . Diabetes Maternal Grandmother   . Diabetes Paternal Grandmother   . Stroke Neg Hx   . Colon cancer Neg Hx   . Colon polyps Neg Hx   . Esophageal cancer Neg Hx   . Ulcerative colitis Neg Hx   . Stomach cancer Neg Hx   . Rectal cancer Neg Hx      Current Outpatient Medications on File Prior to Visit  Medication Sig Dispense Refill  . aspirin (ASPIRIN LOW DOSE) 81 MG EC tablet Take 1 tablet (81 mg total) by mouth daily. Swallow whole. 90 tablet 3  . cetirizine (ZYRTEC) 10 MG chewable tablet Chew 10 mg by mouth daily.    . naproxen (NAPROSYN) 500 MG tablet Take 1 tablet (500 mg total) by mouth 2 (two) times daily with a meal. 60 tablet 0  . niacin 500 MG tablet Take 500 mg by mouth at bedtime.    . Olmesartan-amLODIPine-HCTZ 40-10-25 MG TABS Take 1 tablet by mouth daily. 90 tablet 3  .  omega-3 acid ethyl esters (LOVAZA) 1 g capsule Take 1 capsule (1 g total) by mouth 2 (two) times daily. 360 capsule 3  . rosuvastatin (CRESTOR) 20 MG tablet Take 1 tablet (20 mg total) by mouth daily. 90 tablet 3   No current facility-administered medications on file prior to visit.    Cardiovascular and other pertinent studies:  EKG 07/26/2020: Sinus rhythm 87 bpm Normal EKG   Recent labs: 06/28/2020: Glucose 106, BUN/Cr 17/0.90. EGFR 92. Na/K 135/4.4. AST 42. Rest of the CMP normal H/H 14/44. MCV 88. Platelets 248 HbA1C 6.3% Chol 179, TG 384, HDL 31, LDL 85    Review of Systems  Cardiovascular: Negative for chest pain, dyspnea on exertion, leg swelling, palpitations and syncope.         There were no vitals filed for this visit.   There is no height or weight on file to calculate BMI. There were no vitals filed for this visit.   Objective:   Physical Exam Vitals and nursing note reviewed.  Constitutional:      General: He is not in acute distress. Neck:     Vascular:  No JVD.  Cardiovascular:     Rate and Rhythm: Normal rate and regular rhythm.     Heart sounds: Normal heart sounds. No murmur heard.   Pulmonary:     Effort: Pulmonary effort is normal.     Breath sounds: Normal breath sounds. No wheezing or rales.            Assessment & Recommendations:   60 y/o Caucasian male with hypertension, hyperlipidemia, prediabetes, obesity, family h/o premature CAD  Cardiac risk stratification: Metabolic syndrome with family h/o early CAD. Recommend calcium score scan for risk stratification. Continue Aspirin, statin for now. If he has CAD, will add Vascepa. Will repeat fasting lipid panel in 6 months.   Hypertension: Well controlled  Discussed heart healthy diet and lifestyle modification.     Thank you for referring the patient to [place]. Please feel free to contact with any questions.   Nigel Mormon, MD Pager: [phone] Office: [phone]

## 2021-02-25 NOTE — Progress Notes (Signed)
Patient referred by Carlena Hurl, PA-C for screening for CAD  Subjective:   Steven Fletcher, male    DOB: Nov 10, 1961, 60 y.o.   MRN: [id number]   Chief Complaint  Patient presents with  . Coronary Artery Disease  . Follow-up  . Hyperlipidemia     HPI  60 y/o Caucasian male with hypertension, hyperlipidemia, prediabetes, obesity, family h/o premature CAD  Patient is doing well, denies any chest pain or shortness of breath symptoms.  Blood pressure elevated today, but usually lower at home.  Reviewed recent lipid panel results with the patient, details below.  Initial consultation HPI 08/2020: Patient works for [employer]. He denies chest pain, shortness of breath, palpitations, leg edema, orthopnea, PND, TIA/syncope. He has not been as active physically through the pandemic. His lost his brother at 46 due to fatal MI. He has been on statin fo a long time, but continues to have elevaed TG.     Current Outpatient Medications on File Prior to Visit  Medication Sig Dispense Refill  . aspirin (ASPIRIN LOW DOSE) 81 MG EC tablet Take 1 tablet (81 mg total) by mouth daily. Swallow whole. 90 tablet 3  . cetirizine (ZYRTEC) 10 MG chewable tablet Chew 10 mg by mouth daily.    . naproxen (NAPROSYN) 500 MG tablet Take 1 tablet (500 mg total) by mouth 2 (two) times daily with a meal. 60 tablet 0  . niacin 500 MG tablet Take 500 mg by mouth at bedtime.    . Olmesartan-amLODIPine-HCTZ 40-10-25 MG TABS Take 1 tablet by mouth daily. 90 tablet 3  . omega-3 acid ethyl esters (LOVAZA) 1 g capsule Take 1 capsule (1 g total) by mouth 2 (two) times daily. 360 capsule 3  . rosuvastatin (CRESTOR) 20 MG tablet Take 1 tablet (20 mg total) by mouth daily. 90 tablet 3   No current facility-administered medications on file prior to visit.    Cardiovascular and other pertinent studies:  EKG 02/26/2021: Sinus rhythm 100 bpm  Low voltage in limb leads  Exercise Sestamibi Stress Test  08/21/2020: Normal ECG stress.  Patient reached 10.1 METS, and 97% of age predicted maximum heart rate. Exercise capacity was excellent. Chest pain not reported. Heart rate and hemodynamic response were normal. Stress EKG revealed no ischemic changes. Myocardial perfusion is normal. Overall LV systolic function is normal without regional wall motion abnormalities. Stress LV EF: 53%.  No previous exam available for comparison. Low risk.   CT cardiac scoring 08/03/2020: Total score: 626 LM: 0 LAD: 399 LCx: 3 RCA: 224  Small hiatal hernia. Mild right middle lobe atelectasis.    EKG 07/26/2020: Sinus rhythm 87 bpm Normal EKG   Recent labs: 02/22/2021: Chol 186, TG 215, HDL 39, LDL 110  06/28/2020: Glucose 106, BUN/Cr 17/0.90. EGFR 92. Na/K 135/4.4. AST 42. Rest of the CMP normal H/H 14/44. MCV 88. Platelets 248 HbA1C 6.3% Chol 179, TG 384, HDL 31, LDL 85    Review of Systems  Cardiovascular: Negative for chest pain, dyspnea on exertion, leg swelling, palpitations and syncope.         Vitals:   02/26/21 1142  BP: (!) 144/85  Pulse: 89  Resp: 16  Temp: 98 F (36.7 C)  SpO2: 95%     Body mass index is 34.99 kg/m. Filed Weights   02/26/21 1142  Weight: 258 lb (117 kg)     Objective:   Physical Exam Vitals and nursing note reviewed.  Constitutional:  General: He is not in acute distress. Neck:     Vascular: No JVD.  Cardiovascular:     Rate and Rhythm: Normal rate and regular rhythm.     Heart sounds: Normal heart sounds. No murmur heard.   Pulmonary:     Effort: Pulmonary effort is normal.     Breath sounds: Normal breath sounds. No wheezing or rales.            Assessment & Recommendations:   60 y/o Caucasian male with hypertension, hyperlipidemia, prediabetes, obesity, family h/o premature CAD, elevated calcium score (08/2020), no ischemic on stress test (09/2020)  CAD: Calcium score 626, including LM involvement. No ischemia on stress  test (09/2020) Continue aggressive risk factor modification, including Aspirin, statin  Mixed hyperlipidemia: LDL 110 on Crestor 20 mg.  Increase Crestor to 40 mg daily and repeat lipid panel in 3 months.  Hypertension: Generally well controlled at home.  No change made today.  Monitor for now.   Nigel Mormon, MD Pager: [phone] Office: [phone]

## 2021-02-26 ENCOUNTER — Encounter: Payer: Self-pay | Admitting: Cardiology

## 2021-02-26 ENCOUNTER — Ambulatory Visit: Payer: 59 | Admitting: Cardiology

## 2021-02-26 ENCOUNTER — Other Ambulatory Visit: Payer: Self-pay

## 2021-02-26 VITALS — BP 144/85 | HR 89 | Temp 98.0°F | Resp 16 | Ht 72.0 in | Wt 258.0 lb

## 2021-02-26 DIAGNOSIS — E785 Hyperlipidemia, unspecified: Secondary | ICD-10-CM

## 2021-02-26 DIAGNOSIS — I10 Essential (primary) hypertension: Secondary | ICD-10-CM

## 2021-02-26 DIAGNOSIS — I2510 Atherosclerotic heart disease of native coronary artery without angina pectoris: Secondary | ICD-10-CM

## 2021-02-26 DIAGNOSIS — Z8249 Family history of ischemic heart disease and other diseases of the circulatory system: Secondary | ICD-10-CM

## 2021-02-26 DIAGNOSIS — E782 Mixed hyperlipidemia: Secondary | ICD-10-CM | POA: Insufficient documentation

## 2021-02-26 DIAGNOSIS — R931 Abnormal findings on diagnostic imaging of heart and coronary circulation: Secondary | ICD-10-CM

## 2021-02-26 MED ORDER — ROSUVASTATIN CALCIUM 40 MG PO TABS: 40.0000 mg | ORAL_TABLET | Freq: Every day | ORAL | 3 refills | Status: DC

## 2021-03-07 ENCOUNTER — Encounter: Payer: Self-pay | Admitting: Physician Assistant

## 2021-03-07 ENCOUNTER — Ambulatory Visit: Payer: 59 | Admitting: Physician Assistant

## 2021-03-07 ENCOUNTER — Telehealth: Payer: Self-pay

## 2021-03-07 DIAGNOSIS — M1712 Unilateral primary osteoarthritis, left knee: Secondary | ICD-10-CM

## 2021-03-07 NOTE — Telephone Encounter (Signed)
Please get auth for left knee gel injection-gil pt 

## 2021-03-07 NOTE — Progress Notes (Signed)
HPI: Steven Fletcher returns today follow-up of a cortisone injection left knee.  He is now 2 weeks status post injection.  He feels that the injections really helped.  He states sharp pain is gone.  He is having no mechanical symptoms.  He feels 50% improved.  He is working on Forensic scientist. Notes he is able to work now 8 to 9 hours without need really getting a lot of pain at this feels stiff at the end of the day.  Begin radiographs dated 02/12/2021 left knee showed mild to moderate medial compartmental narrowing mild patellofemoral changes.  Review of systems see HPI otherwise negative  Physical exam: General well-developed well-nourished male no acute distress Bilateral knees: Good range of motion both knees.  Right greater than left patellofemoral crepitus.  Left knee no abnormal warmth erythema or effusion.  Tenderness along medial joint line.  Impression: Left knee osteoarthritis  Plan: Recommend continued quad strengthening.  He will begin the use of Voltaren gel up to 4 g 4 times daily to the knee.  Recommend supplemental injection we will try to gain approval for this.  Questions encouraged and answered at length.  We will having back once supplemental injections available.

## 2021-03-08 NOTE — Telephone Encounter (Signed)
Noted  

## 2021-04-09 ENCOUNTER — Telehealth: Payer: Self-pay

## 2021-04-09 NOTE — Telephone Encounter (Signed)
VOB has been submitted for Durolane, left knee. Pending BV.

## 2021-04-10 ENCOUNTER — Telehealth: Payer: Self-pay

## 2021-04-10 NOTE — Telephone Encounter (Signed)
Approved for Durolane, left knee. [place] has been met Patient will be responsible for 15% OOP. $40.00 Co-pay No PA required

## 2021-04-11 ENCOUNTER — Telehealth: Payer: Self-pay

## 2021-04-11 NOTE — Telephone Encounter (Signed)
Called and left a VM for patient to call and make an appointment with Erskine Emery or Dr. Ninfa Linden for gel injection.  Approved for Durolane, left knee. [place] has been met. Patient will be responsible for 15% OOP. Co-pay of 40.00 No PA required

## 2021-04-23 ENCOUNTER — Ambulatory Visit: Payer: 59 | Admitting: Physician Assistant

## 2021-04-23 ENCOUNTER — Encounter: Payer: Self-pay | Admitting: Physician Assistant

## 2021-04-23 DIAGNOSIS — M1712 Unilateral primary osteoarthritis, left knee: Secondary | ICD-10-CM | POA: Diagnosis not present

## 2021-04-23 MED ORDER — LIDOCAINE HCL 1 % IJ SOLN
0.5000 mL | INTRAMUSCULAR | Status: AC | PRN
  Administered 2021-04-23: .5 mL

## 2021-04-23 MED ORDER — SODIUM HYALURONATE 60 MG/3ML IX PRSY
60.0000 mg | PREFILLED_SYRINGE | INTRA_ARTICULAR | Status: AC | PRN
  Administered 2021-04-23: 60 mg via INTRA_ARTICULAR

## 2021-04-23 NOTE — Progress Notes (Signed)
   Procedure Note  Patient: Steven Fletcher             Date of Birth: [birth year]           MRN: [id number]             Visit Date: 04/23/2021 Mr. Krisko has known osteoarthritis left knee    Radiographs show evidence of joint space narrowing, osteophytes, subchondral sclerosis and/or subchondral cysts.  This patient has knee pain which interferes with functional and activities of daily living.   This patient has experienced inadequate response, adverse effects and/or intolerance with conservative treatments such as acetaminophen, NSAIDS, topical creams, physical therapy or regular exercise, knee bracing and/or weight loss.  This patient is not scheduled to have a total knee replacement within 6 months of starting treatment with viscosupplementation.  Physical exam: Left knee good range of motion.  No abnormal warmth.  Slight effusion.  Procedures: Visit Diagnoses:  1. Primary osteoarthritis of left knee     Large Joint Inj: L knee on 04/23/2021 3:20 PM Indications: pain Details: 22 G 1.5 in needle, anterolateral approach  Arthrogram: No  Medications: 0.5 mL lidocaine 1 %; 60 mg Sodium Hyaluronate 60 MG/3ML Aspirate: 7 mL yellow and blood-tinged Outcome: tolerated well, no immediate complications Procedure, treatment alternatives, risks and benefits explained, specific risks discussed. Consent was given by the patient. Immediately prior to procedure a time out was called to verify the correct patient, procedure, equipment, support staff and site/side marked as required. Patient was prepped and draped in the usual sterile fashion.    Plan: We will see him back in 8 weeks to see what type of response he had to the supplemental injection.  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  Questions encouraged and answered.

## 2021-05-29 ENCOUNTER — Other Ambulatory Visit (HOSPITAL_COMMUNITY): Payer: Self-pay | Admitting: Cardiology

## 2021-05-30 ENCOUNTER — Other Ambulatory Visit: Payer: Self-pay

## 2021-05-30 ENCOUNTER — Ambulatory Visit: Payer: 59 | Admitting: Cardiology

## 2021-05-30 ENCOUNTER — Encounter: Payer: Self-pay | Admitting: Cardiology

## 2021-05-30 VITALS — BP 108/65 | HR 83 | Temp 98.3°F | Resp 17 | Ht 72.0 in | Wt 261.0 lb

## 2021-05-30 DIAGNOSIS — E781 Pure hyperglyceridemia: Secondary | ICD-10-CM | POA: Insufficient documentation

## 2021-05-30 DIAGNOSIS — I2510 Atherosclerotic heart disease of native coronary artery without angina pectoris: Secondary | ICD-10-CM

## 2021-05-30 DIAGNOSIS — E782 Mixed hyperlipidemia: Secondary | ICD-10-CM

## 2021-05-30 DIAGNOSIS — R931 Abnormal findings on diagnostic imaging of heart and coronary circulation: Secondary | ICD-10-CM

## 2021-05-30 LAB — LIPID PANEL
Chol/HDL Ratio: 5.2 ratio — ABNORMAL HIGH (ref 0.0–5.0)
Cholesterol, Total: 176 mg/dL (ref 100–199)
HDL: 34 mg/dL — ABNORMAL LOW (ref >39)
LDL Chol Calc (NIH): 77 mg/dL (ref 0–99)
Triglycerides: 407 mg/dL — ABNORMAL HIGH (ref 0–149)
VLDL Cholesterol Cal: 65 mg/dL — ABNORMAL HIGH (ref 5–40)

## 2021-05-30 MED ORDER — ICOSAPENT ETHYL 1 G PO CAPS: 2.0000 g | ORAL_CAPSULE | Freq: Two times a day (BID) | ORAL | 2 refills | Status: DC

## 2021-05-30 NOTE — Progress Notes (Signed)
Patient referred by Carlena Hurl, PA-C for screening for CAD  Subjective:   Steven Fletcher, male    DOB: Apr 28, 1961, 60 y.o.   MRN: [id number]   Chief Complaint  Patient presents with   Follow-up    3 months   Coronary artery disease involving native coronary artery of     HPI  60 y/o Caucasian male with hypertension, hyperlipidemia, prediabetes, obesity, family h/o premature CAD  Patient is doing well, denies any chest pain or shortness of breath symptoms.    Reviewed recent lipid panel results with the patient, details below.  Initial consultation HPI 08/2020: Patient works for [employer]. He denies chest pain, shortness of breath, palpitations, leg edema, orthopnea, PND, TIA/syncope. He has not been as active physically through the pandemic. His lost his brother at 12 due to fatal MI. He has been on statin fo a long time, but continues to have elevaed TG.     Current Outpatient Medications on File Prior to Visit  Medication Sig Dispense Refill   aspirin (ASPIRIN LOW DOSE) 81 MG EC tablet Take 1 tablet (81 mg total) by mouth daily. Swallow whole. 90 tablet 3   cetirizine (ZYRTEC) 10 MG chewable tablet Chew 10 mg by mouth daily.     niacin 500 MG tablet Take 500 mg by mouth at bedtime.     Olmesartan-amLODIPine-HCTZ 40-10-25 MG TABS Take 1 tablet by mouth daily. 90 tablet 3   omega-3 acid ethyl esters (LOVAZA) 1 g capsule Take 1 capsule (1 g total) by mouth 2 (two) times daily. 360 capsule 3   rosuvastatin (CRESTOR) 40 MG tablet Take 1 tablet (40 mg total) by mouth daily. 90 tablet 3   No current facility-administered medications on file prior to visit.    Cardiovascular and other pertinent studies:  EKG 02/26/2021: Sinus rhythm 100 bpm  Low voltage in limb leads  Exercise Sestamibi Stress Test 08/21/2020: Normal ECG stress.  Patient reached 10.1 METS, and 97% of age predicted maximum heart rate. Exercise capacity was excellent. Chest pain not reported.  Heart rate and hemodynamic response were normal. Stress EKG revealed no ischemic changes. Myocardial perfusion is normal. Overall LV systolic function is normal without regional wall motion abnormalities. Stress LV EF: 53%.  No previous exam available for comparison. Low risk.   CT cardiac scoring 08/03/2020: Total score: 626 LM: 0 LAD: 399 LCx: 3 RCA: 224  Small hiatal hernia. Mild right middle lobe atelectasis.    EKG 07/26/2020: Sinus rhythm 87 bpm Normal EKG   Recent labs: 05/29/2021: Chol 176, TG 407, HDL 34, LDL 77  02/22/2021: Chol 186, TG 215, HDL 39, LDL 110  06/28/2020: Glucose 106, BUN/Cr 17/0.90. EGFR 92. Na/K 135/4.4. AST 42. Rest of the CMP normal H/H 14/44. MCV 88. Platelets 248 HbA1C 6.3% Chol 179, TG 384, HDL 31, LDL 85    Review of Systems  Cardiovascular:  Negative for chest pain, dyspnea on exertion, leg swelling, palpitations and syncope.        Vitals:   05/30/21 1403  BP: 108/65  Pulse: 83  Resp: 17  Temp: 98.3 F (36.8 C)  SpO2: 96%    Body mass index is 35.4 kg/m. Filed Weights   05/30/21 1403  Weight: 261 lb (118.4 kg)     Objective:   Physical Exam Vitals and nursing note reviewed.  Constitutional:      General: He is not in acute distress. Neck:     Vascular: No JVD.  Cardiovascular:     Rate and Rhythm: Normal rate and regular rhythm.     Heart sounds: Normal heart sounds. No murmur heard. Pulmonary:     Effort: Pulmonary effort is normal.     Breath sounds: Normal breath sounds. No wheezing or rales.  Musculoskeletal:     Right lower leg: No edema.     Left lower leg: No edema.           Assessment & Recommendations:   60 y/o Caucasian male with hypertension, hyperlipidemia, prediabetes, obesity, family h/o premature CAD, elevated calcium score (08/2020), no ischemic on stress test (09/2020)  CAD: Calcium score 626, including LM involvement. No ischemia on stress test (09/2020) Continue aggressive risk  factor modification, including Aspirin, statin  Mixed hyperlipidemia: LDL 77, TG 407 on Crestor 40 mg. Change Lovaza to Vascepa 2  bid. Discussed low carbohydrate and low saturated fat diet. Repeat fasting lipid panel in 3 months  Hypertension: Well controlled   Nigel Mormon, MD Pager: [phone] Office: [phone]

## 2021-06-18 ENCOUNTER — Other Ambulatory Visit: Payer: Self-pay

## 2021-06-18 ENCOUNTER — Encounter: Payer: Self-pay | Admitting: Physician Assistant

## 2021-06-18 ENCOUNTER — Ambulatory Visit: Payer: 59 | Admitting: Physician Assistant

## 2021-06-18 DIAGNOSIS — M1712 Unilateral primary osteoarthritis, left knee: Secondary | ICD-10-CM | POA: Diagnosis not present

## 2021-06-18 NOTE — Progress Notes (Signed)
HPI: Mr. Yakubov returns today follow-up left knee status post Durolane injection.  He states he is about 80% better.  He feels the injection helped.  He states he can live with the pain and achiness that he has at this point.  He just has some achiness at the end of shifts or if he is bending down at work for long period time he has some discomfort whenever he first straightens the knee out.  Not taking any NSAIDs or any medications for the knee at this point.  Denies any mechanical symptoms left knee.  He has began exercising and is using a stationary bike and doing leg lifts.  Review of systems: See HPI otherwise negative  Physical exam: General well-developed well-nourished male no acute distress mood and affect appropriate Left knee: Full extension full flexion.  Passive range of motion reveals slight crepitus.  No instability valgus varus stressing.  Slight effusion no abnormal warmth erythema.  Impression: Left knee osteoarthritis  Plan: He will continue to work on quad strengthening of the left knee.  He understands to wait at least 3 months between cortisone injections in 6 months between supplemental injections.  He will follow-up with [place] as needed.  Questions encouraged and answered at length.

## 2021-07-06 ENCOUNTER — Other Ambulatory Visit: Payer: Self-pay | Admitting: Medical

## 2021-08-27 ENCOUNTER — Other Ambulatory Visit: Payer: Self-pay

## 2021-08-27 DIAGNOSIS — I2510 Atherosclerotic heart disease of native coronary artery without angina pectoris: Secondary | ICD-10-CM

## 2021-08-29 LAB — LIPID PANEL
Chol/HDL Ratio: 5.1 ratio — ABNORMAL HIGH (ref 0.0–5.0)
Cholesterol, Total: 152 mg/dL (ref 100–199)
HDL: 30 mg/dL — ABNORMAL LOW (ref >39)
LDL Chol Calc (NIH): 63 mg/dL (ref 0–99)
Triglycerides: 382 mg/dL — ABNORMAL HIGH (ref 0–149)
VLDL Cholesterol Cal: 59 mg/dL — ABNORMAL HIGH (ref 5–40)

## 2021-08-29 NOTE — Progress Notes (Signed)
Patient referred by Carlena Hurl, PA-C for screening for CAD  Subjective:   Steven Fletcher, male    DOB: 05/31/1961, 60 y.o.   MRN: [id number]   Chief Complaint  Patient presents with   Coronary Artery Disease     HPI  60 y/o Caucasian male with hypertension, hyperlipidemia, prediabetes, obesity, family h/o premature CAD  Patient is doing well, denies any chest pain or shortness of breath symptoms.    Reviewed recent lipid panel results with the patient, details below. He admits that he has not been physically active, but has decided to "turn the corner" with diet and exercise.   Initial consultation HPI 08/2020: Patient works for [employer]. He denies chest pain, shortness of breath, palpitations, leg edema, orthopnea, PND, TIA/syncope. He has not been as active physically through the pandemic. His lost his brother at 65 due to fatal MI. He has been on statin fo a long time, but continues to have elevaed TG.     Current Outpatient Medications on File Prior to Visit  Medication Sig Dispense Refill   aspirin (ASPIRIN LOW DOSE) 81 MG EC tablet Take 1 tablet (81 mg total) by mouth daily. Swallow whole. 90 tablet 3   cetirizine (ZYRTEC) 10 MG chewable tablet Chew 10 mg by mouth daily.     icosapent Ethyl (VASCEPA) 1 g capsule Take 2 capsules (2 g total) by mouth 2 (two) times daily. 120 capsule 2   niacin 500 MG tablet Take 500 mg by mouth at bedtime.     Olmesartan-amLODIPine-HCTZ 40-10-25 MG TABS TAKE 1 TABLET BY MOUTH  DAILY 90 tablet 0   rosuvastatin (CRESTOR) 40 MG tablet Take 1 tablet (40 mg total) by mouth daily. 90 tablet 3   No current facility-administered medications on file prior to visit.    Cardiovascular and other pertinent studies:  EKG 08/30/2021: Sinus rhythm 77 bpm Normal EKG  Exercise Sestamibi Stress Test 08/21/2020: Normal ECG stress.  Patient reached 10.1 METS, and 97% of age predicted maximum heart rate. Exercise capacity was excellent.  Chest pain not reported. Heart rate and hemodynamic response were normal. Stress EKG revealed no ischemic changes. Myocardial perfusion is normal. Overall LV systolic function is normal without regional wall motion abnormalities. Stress LV EF: 53%.  No previous exam available for comparison. Low risk.   CT cardiac scoring 08/03/2020: Total score: 626 LM: 0 LAD: 399 LCx: 3 RCA: 224  Small hiatal hernia. Mild right middle lobe atelectasis.  Recent labs: 08/29/2021: Chol 152, TG 382, HDL 30, LDL 63  05/29/2021: Chol 176, TG 407, HDL 34, LDL 77  02/22/2021: Chol 186, TG 215, HDL 39, LDL 110  06/28/2020: Glucose 106, BUN/Cr 17/0.90. EGFR 92. Na/K 135/4.4. AST 42. Rest of the CMP normal H/H 14/44. MCV 88. Platelets 248 HbA1C 6.3% Chol 179, TG 384, HDL 31, LDL 85    Review of Systems  Cardiovascular:  Negative for chest pain, dyspnea on exertion, leg swelling, palpitations and syncope.        Vitals:   08/30/21 1415  BP: 126/77  Pulse: 79  Temp: 97.9 F (36.6 C)  SpO2: 99%    Body mass index is 35.8 kg/m. Filed Weights   08/30/21 1415  Weight: 264 lb (119.7 kg)     Objective:   Physical Exam Vitals and nursing note reviewed.  Constitutional:      General: He is not in acute distress. Neck:     Vascular: No JVD.  Cardiovascular:  Rate and Rhythm: Normal rate and regular rhythm.     Heart sounds: Normal heart sounds. No murmur heard. Pulmonary:     Effort: Pulmonary effort is normal.     Breath sounds: Normal breath sounds. No wheezing or rales.  Musculoskeletal:     Right lower leg: No edema.     Left lower leg: No edema.           Assessment & Recommendations:   60 y/o Caucasian male with hypertension, hyperlipidemia, prediabetes, obesity, family h/o premature CAD, elevated calcium score (08/2020), no ischemic on stress test (09/2020)  CAD: Calcium score 626, including LM involvement. No ischemia on stress test (09/2020) Continue aggressive  risk factor modification, including Aspirin, statin  Mixed hyperlipidemia: LDL 63, TG 382 on Crestor 40 mg, Vascepa 2  bid. Discussed low carbohydrate and low saturated fat diet. Will repeat fasting lipid panel in 3 months. If TG remain elevated, will start Tricor. May nee to discontinue HCTZ then to avoid myalgias.  Hypertension: Well controlled  F/u in 12/2021.   Nigel Mormon, MD Pager: [phone] Office: [phone]

## 2021-08-30 ENCOUNTER — Other Ambulatory Visit: Payer: Self-pay

## 2021-08-30 ENCOUNTER — Ambulatory Visit: Payer: 59 | Admitting: Cardiology

## 2021-08-30 ENCOUNTER — Encounter: Payer: Self-pay | Admitting: Cardiology

## 2021-08-30 VITALS — BP 126/77 | HR 79 | Temp 97.9°F | Ht 72.0 in | Wt 264.0 lb

## 2021-08-30 DIAGNOSIS — I2510 Atherosclerotic heart disease of native coronary artery without angina pectoris: Secondary | ICD-10-CM

## 2021-08-30 DIAGNOSIS — E782 Mixed hyperlipidemia: Secondary | ICD-10-CM

## 2021-09-13 ENCOUNTER — Other Ambulatory Visit: Payer: Self-pay | Admitting: Medical

## 2021-09-17 ENCOUNTER — Other Ambulatory Visit: Payer: Self-pay | Admitting: Cardiology

## 2021-09-17 DIAGNOSIS — I2510 Atherosclerotic heart disease of native coronary artery without angina pectoris: Secondary | ICD-10-CM

## 2021-10-16 ENCOUNTER — Telehealth: Payer: Self-pay | Admitting: Medical

## 2021-10-16 NOTE — Telephone Encounter (Signed)
Pt called stated he received message that he needs lipid done tomorrow but he states he just had one done with cardiology 08/28/21, results are in Epic

## 2021-10-17 ENCOUNTER — Other Ambulatory Visit: Payer: Self-pay

## 2021-10-17 ENCOUNTER — Encounter: Payer: Self-pay | Admitting: Medical

## 2021-10-17 ENCOUNTER — Ambulatory Visit: Payer: 59 | Admitting: Medical

## 2021-10-17 VITALS — BP 118/74 | HR 90 | Ht 71.0 in | Wt 258.0 lb

## 2021-10-17 DIAGNOSIS — I2510 Atherosclerotic heart disease of native coronary artery without angina pectoris: Secondary | ICD-10-CM

## 2021-10-17 DIAGNOSIS — Z23 Encounter for immunization: Secondary | ICD-10-CM | POA: Diagnosis not present

## 2021-10-17 DIAGNOSIS — E785 Hyperlipidemia, unspecified: Secondary | ICD-10-CM

## 2021-10-17 DIAGNOSIS — E8881 Metabolic syndrome: Secondary | ICD-10-CM

## 2021-10-17 DIAGNOSIS — E782 Mixed hyperlipidemia: Secondary | ICD-10-CM

## 2021-10-17 DIAGNOSIS — Z0000 Encounter for general adult medical examination without abnormal findings: Secondary | ICD-10-CM

## 2021-10-17 DIAGNOSIS — M6208 Separation of muscle (nontraumatic), other site: Secondary | ICD-10-CM

## 2021-10-17 DIAGNOSIS — E669 Obesity, unspecified: Secondary | ICD-10-CM

## 2021-10-17 DIAGNOSIS — I10 Essential (primary) hypertension: Secondary | ICD-10-CM

## 2021-10-17 DIAGNOSIS — R7301 Impaired fasting glucose: Secondary | ICD-10-CM

## 2021-10-17 DIAGNOSIS — Z125 Encounter for screening for malignant neoplasm of prostate: Secondary | ICD-10-CM

## 2021-10-17 DIAGNOSIS — Z8249 Family history of ischemic heart disease and other diseases of the circulatory system: Secondary | ICD-10-CM

## 2021-10-17 NOTE — Progress Notes (Signed)
Subjective:   HPI  Steven Fletcher is a 60 y.o. male who presents for a complete physical.    Concerns: No concerns.  Recently saw cardiology, and he notes he needs to do better with diet.    Reviewed their medical, surgical, family, social, medication, and allergy history and updated chart as appropriate.  Past Medical History:  Diagnosis Date   [place]atston coronary artery calcium score greater than 400 08/21/2020   Allergy    seasonal    Arthritis of left knee    Back pain    intermittent pain, sees Dr. Limmie Patricia, Chiropractic   Family history of premature CAD    Hypertension    Impaired fasting blood sugar    Mixed dyslipidemia 2005   hereditary   Obesity    Wears glasses     Past Surgical History:  Procedure Laterality Date   COLONOSCOPY  2010   [place]; normal, repeat 2020   COLONOSCOPY  06/2019   benign polyps, diverticulosis, Dr. 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  Working [employer], Merchant navy officer.  1 child.    No significant other.  10/2021   Social Determinants of Health   Financial Resource Strain: Not on file  Food Insecurity: Not on file  Transportation Needs: Not on file  Physical Activity: Not on file  Stress: Not on file  Social Connections: Not on file  Intimate Partner Violence: Not on file    Family History  Problem Relation Age of Onset   Alzheimer's disease Mother    Hyperlipidemia Mother    Hypertension Father    Other Father        died of old age   34 Sister        breast   Hyperlipidemia Sister    Heart disease Brother 65       died of MI   Hyperlipidemia Brother    Diabetes Maternal Grandmother    Diabetes Paternal Grandmother    Stroke Neg Hx    Colon cancer Neg Hx    Colon polyps Neg Hx    Esophageal cancer Neg Hx    Ulcerative colitis Neg Hx    Stomach cancer Neg Hx    Rectal cancer Neg Hx  Current Outpatient Medications:    aspirin (ASPIRIN LOW DOSE) 81 MG EC tablet, Take 1 tablet (81 mg total) by mouth daily. Swallow whole., Disp: 90 tablet, Rfl: 3   cetirizine (ZYRTEC) 10 MG chewable tablet, Chew 10 mg by mouth daily., Disp: , Rfl:    icosapent Ethyl (VASCEPA) 1 g capsule, TAKE 2 CAPSULES(2 GRAMS) BY MOUTH TWICE DAILY, Disp: 120 capsule, Rfl: 2   niacin 500 MG tablet, Take 500 mg by mouth at bedtime., Disp: , Rfl:    Olmesartan-amLODIPine-HCTZ 40-10-25 MG TABS, TAKE 1 TABLET BY MOUTH  DAILY, Disp: 90 tablet, Rfl: 0   rosuvastatin (CRESTOR) 40 MG tablet, Take 1 tablet (40 mg total) by mouth daily., Disp: 90 tablet, Rfl: 3  No Known Allergies  Review of Systems Constitutional: -fever, -chills, -sweats, -unexpected weight change, -decreased appetite, -fatigue Allergy: -sneezing, -itching, -congestion Dermatology: -changing moles, --rash, +lumps ENT: -runny nose, -ear pain, -sore throat, -hoarseness, -sinus pain, -teeth pain, - ringing in ears, -hearing loss, -nosebleeds Cardiology: -chest pain, -palpitations, -swelling, -difficulty breathing  when lying flat, -waking up short of breath Respiratory: -cough, -shortness of breath, -difficulty breathing with exercise or exertion, -wheezing, -coughing up blood Gastroenterology: -abdominal pain, -nausea, -vomiting, -diarrhea, -constipation, -blood in stool, -changes in bowel movement, -difficulty swallowing or eating Hematology: -bleeding, -bruising  Musculoskeletal: -joint aches, -muscle aches, -joint swelling, -back pain, -neck pain, -cramping, -changes in gait Ophthalmology: denies vision changes, eye redness, itching, discharge Urology: -burning with urination, -difficulty urinating, -blood in urine, -urinary frequency, -urgency, -incontinence Neurology: -headache, -weakness, -tingling, -numbness, -memory loss, -falls, -dizziness Psychology: -depressed mood, -agitation, -sleep problems     Objective:   Physical Exam  BP 118/74 (BP Location: Right Arm, Patient Position: Sitting)   Pulse 90   Ht 5\' 11"  (1.803 m)   Wt 258 lb (117 kg)   SpO2 94%   BMI 35.98 kg/m   General appearance: alert, no distress, WD/WN, white male Skin: scattered macules, no worrisome lesions Neck: supple, no lymphadenopathy, no thyromegaly, no masses, normal ROM, no bruits Chest: non tender, normal shape and expansion Heart: RRR, normal S1, S2, no murmurs Lungs: CTA bilaterally, no wheezes, rhonchi, or rales Abdomen: +bs, soft, bulge in upper abdomen c/w diastasis recti, non tender, non distended, no masses, no hepatomegaly, no splenomegaly, no bruits Back: non tender, normal ROM, no scoliosis Musculoskeletal: right dorsal wrist laterally with small surgical scar, right medial and lateral elbow surgical scars,  anterior port surgical scars both knees, upper extremities non tender, no obvious deformity, normal ROM throughout, lower extremities non tender, no obvious deformity, normal ROM throughout Extremities: no edema, no cyanosis, no clubbing Pulses: 2+ symmetric, upper and lower extremities, normal  cap refill Neurological: alert, oriented x 3, CN2-12 intact, strength normal upper extremities and lower extremities, sensation normal throughout, DTRs 2+ throughout, no cerebellar signs, gait normal Psychiatric: normal affect, behavior normal, pleasant  GU: normal male external genitalia, circumcised, nontender, no masses, no hernia, no lymphadenopathy Rectal: anus normal tone, prostate mildly enlarged    Assessment and Plan :    Encounter Diagnoses  Name Primary?   Encounter for health maintenance examination in adult Yes   Need for pneumococcal vaccination    Dyslipidemia    Coronary artery disease involving native coronary artery of native heart without angina pectoris    Screening for prostate cancer    Obesity with serious comorbidity, unspecified classification, unspecified obesity type    Impaired fasting blood sugar    Metabolic syndrome    Mixed hyperlipidemia  Family history of premature CAD    Essential hypertension    Diastasis recti     Physical exam - discussed and counseled on healthy lifestyle, diet, exercise, preventative care, vaccinations, sick and well care, proper use of emergency dept and after hours care, and addressed their concerns.    Health screening: See your eye doctor yearly for routine vision care. See your dentist yearly for routine dental care including hygiene visits twice yearly.  Cancer screening Colonoscopy:  Reviewed colonoscopy on file that is up to date  Discussed PSA, prostate exam, and prostate cancer screening risks/benefits.      Vaccinations: Immunization History  Administered Date(s) Administered   Influenza-Unspecified 10/02/2016, 10/02/2018, 09/26/2021   PFIZER(Purple Top)SARS-COV-2 Vaccination 02/04/2020, 03/09/2020   Pfizer Covid-19 Vaccine Bivalent Booster 68yrs & up 09/26/2021   Tdap 08/02/2014   Zoster Recombinat (Shingrix) 06/28/2020, 08/29/2020   Counseled on the pneumococcal vaccine.  Vaccine information sheet  given.  Pneumococcal vaccine PPSV20 given after consent obtained.   Acute issues discussed: none  Separate significant chronic issues discussed Hypertension-continue current medication  CAD, Hyperlipidemia-continue current medication, follow up with cardiology 12/2021 as planned fasting  Obesity-work on efforts to lose weight through healthy diet and exercise  Plantar fasciitis/advised several night splints, continue supportive care that he is doing already, avoid going without shoes    Keshan was seen today for annual exam.  Diagnoses and all orders for this visit:  Encounter for health maintenance examination in adult -     Comprehensive metabolic panel -     CBC -     Hemoglobin A1c -     PSA  Need for pneumococcal vaccination  Dyslipidemia  Coronary artery disease involving native coronary artery of native heart without angina pectoris  Screening for prostate cancer -     PSA  Obesity with serious comorbidity, unspecified classification, unspecified obesity type  Impaired fasting blood sugar -     Hemoglobin Y0K  Metabolic syndrome  Mixed hyperlipidemia  Family history of premature CAD  Essential hypertension  Diastasis recti  F/u pending labs

## 2021-10-18 LAB — COMPREHENSIVE METABOLIC PANEL
ALT: 30 IU/L (ref 0–44)
AST: 36 IU/L (ref 0–40)
Albumin/Globulin Ratio: 1.6 (ref 1.2–2.2)
Albumin: 4.9 g/dL — ABNORMAL HIGH (ref 3.8–4.8)
Alkaline Phosphatase: 82 IU/L (ref 44–121)
BUN/Creatinine Ratio: 17 (ref 10–24)
BUN: 20 mg/dL (ref 8–27)
Bilirubin Total: 0.4 mg/dL (ref 0.0–1.2)
CO2: 21 mmol/L (ref 20–29)
Calcium: 10.1 mg/dL (ref 8.6–10.2)
Chloride: 98 mmol/L (ref 96–106)
Creatinine, Ser: 1.2 mg/dL (ref 0.76–1.27)
Globulin, Total: 3 g/dL (ref 1.5–4.5)
Glucose: 111 mg/dL — ABNORMAL HIGH (ref 70–99)
Potassium: 4.8 mmol/L (ref 3.5–5.2)
Sodium: 137 mmol/L (ref 134–144)
Total Protein: 7.9 g/dL (ref 6.0–8.5)
eGFR: 68 mL/min/{1.73_m2} (ref >59)

## 2021-10-18 LAB — CBC
Hematocrit: 41.6 % (ref 37.5–51.0)
Hemoglobin: 13.8 g/dL (ref 13.0–17.7)
MCH: 27.6 pg (ref 26.6–33.0)
MCHC: 33.2 g/dL (ref 31.5–35.7)
MCV: 83 fL (ref 79–97)
Platelets: 292 10*3/uL (ref 150–450)
RBC: 5 x10E6/uL (ref 4.14–5.80)
RDW: 13.8 % (ref 11.6–15.4)
WBC: 9.4 10*3/uL (ref 3.4–10.8)

## 2021-10-18 LAB — PSA: Prostate Specific Ag, Serum: 2.3 ng/mL (ref 0.0–4.0)

## 2021-10-18 LAB — HEMOGLOBIN A1C
Est. average glucose Bld gHb Est-mCnc: 151 mg/dL
Hgb A1c MFr Bld: 6.9 % — ABNORMAL HIGH (ref 4.8–5.6)

## 2021-10-24 ENCOUNTER — Telehealth: Payer: Self-pay

## 2021-10-24 NOTE — Telephone Encounter (Signed)
Will submit for Durolane, left knee.

## 2021-10-24 NOTE — Telephone Encounter (Signed)
VOB submitted for Durolane, left knee. °BV pending. °

## 2021-10-29 ENCOUNTER — Telehealth: Payer: Self-pay

## 2021-10-29 NOTE — Telephone Encounter (Signed)
Approved for Durolane, left knee. [place] has been met Patient will be responsible for 15% OOP. Co-pay of $40.00 No PA required  Appt. 11/12/2021 with Erskine Emery

## 2021-11-12 ENCOUNTER — Encounter: Payer: Self-pay | Admitting: Physician Assistant

## 2021-11-12 ENCOUNTER — Ambulatory Visit: Payer: 59 | Admitting: Physician Assistant

## 2021-11-12 ENCOUNTER — Other Ambulatory Visit: Payer: Self-pay

## 2021-11-12 DIAGNOSIS — M1712 Unilateral primary osteoarthritis, left knee: Secondary | ICD-10-CM | POA: Diagnosis not present

## 2021-11-12 MED ORDER — LIDOCAINE HCL 1 % IJ SOLN
3.0000 mL | INTRAMUSCULAR | Status: AC | PRN
  Administered 2021-11-12: 3 mL

## 2021-11-12 MED ORDER — SODIUM HYALURONATE 60 MG/3ML IX PRSY
60.0000 mg | PREFILLED_SYRINGE | INTRA_ARTICULAR | Status: AC | PRN
  Administered 2021-11-12: 60 mg via INTRA_ARTICULAR

## 2021-11-12 NOTE — Progress Notes (Signed)
   Procedure Note  Patient: Steven Fletcher             Date of Birth: 15-Oct-1961           MRN: [id number]             Visit Date: 11/12/2021 Mr. Turbin returns today for scheduled Durolane injection of his left knee.  He has had no new injuries or falls.  States over the last couple weeks he has developed some anterior medial discomfort.  He otherwise has done well.  Last supplemental injection was 04/23/2021.  Physical exam: Left knee good range of motion of the knee.  No abnormal warmth erythema.  Slight effusion.  Procedures: Visit Diagnoses:  1. Primary osteoarthritis of left knee     Large Joint Inj: L knee on 11/12/2021 4:13 PM Indications: pain Details: 22 G 1.5 in needle  Arthrogram: No  Medications: 3 mL lidocaine 1 %; 60 mg Sodium Hyaluronate 60 MG/3ML Aspirate: 4 mL Outcome: tolerated well, no immediate complications Procedure, treatment alternatives, risks and benefits explained, specific risks discussed. Consent was given by the patient. Immediately prior to procedure a time out was called to verify the correct patient, procedure, equipment, support staff and site/side marked as required. Patient was prepped and draped in the usual sterile fashion.    Plan: He will follow-up with [place] on an as-needed basis.  He knows to wait at least 3 months between cortisone injections in 6 months between supplemental injections.  Questions were encouraged and answered.

## 2021-12-02 ENCOUNTER — Other Ambulatory Visit: Payer: Self-pay | Admitting: Medical

## 2021-12-31 ENCOUNTER — Ambulatory Visit: Payer: 59 | Admitting: Cardiology

## 2021-12-31 ENCOUNTER — Other Ambulatory Visit: Payer: Self-pay | Admitting: Cardiology

## 2021-12-31 DIAGNOSIS — I2510 Atherosclerotic heart disease of native coronary artery without angina pectoris: Secondary | ICD-10-CM

## 2022-01-04 ENCOUNTER — Other Ambulatory Visit: Payer: Self-pay

## 2022-01-04 DIAGNOSIS — E782 Mixed hyperlipidemia: Secondary | ICD-10-CM

## 2022-01-04 DIAGNOSIS — E781 Pure hyperglyceridemia: Secondary | ICD-10-CM

## 2022-01-09 ENCOUNTER — Other Ambulatory Visit: Payer: Self-pay

## 2022-01-09 ENCOUNTER — Ambulatory Visit: Payer: 59 | Admitting: Cardiology

## 2022-01-09 ENCOUNTER — Encounter: Payer: Self-pay | Admitting: Cardiology

## 2022-01-09 VITALS — BP 114/68 | HR 85 | Temp 98.4°F | Resp 16 | Ht 71.0 in | Wt 257.8 lb

## 2022-01-09 DIAGNOSIS — E781 Pure hyperglyceridemia: Secondary | ICD-10-CM

## 2022-01-09 DIAGNOSIS — E782 Mixed hyperlipidemia: Secondary | ICD-10-CM

## 2022-01-09 LAB — LIPID PANEL
Chol/HDL Ratio: 5.5 ratio — ABNORMAL HIGH (ref 0.0–5.0)
Cholesterol, Total: 144 mg/dL (ref 100–199)
HDL: 26 mg/dL — ABNORMAL LOW (ref >39)
LDL Chol Calc (NIH): 62 mg/dL (ref 0–99)
Triglycerides: 362 mg/dL — ABNORMAL HIGH (ref 0–149)
VLDL Cholesterol Cal: 56 mg/dL — ABNORMAL HIGH (ref 5–40)

## 2022-01-09 MED ORDER — FENOFIBRATE 145 MG PO TABS: 145.0000 mg | ORAL_TABLET | Freq: Every day | ORAL | 3 refills | Status: DC

## 2022-01-09 NOTE — Progress Notes (Signed)
Patient referred by Carlena Hurl, PA-C for screening for CAD  Subjective:   Steven Fletcher, male    DOB: 05/04/1961, 61 y.o.   MRN: [id number]   Chief Complaint  Patient presents with   Coronary Artery Disease   Follow-up    74 month     HPI  61 y/o Caucasian male with hypertension, hyperlipidemia, prediabetes, obesity, family h/o premature CAD  Patient is doing well, walks up to 3-4 miles without any symptoms. He has made changes to his diet to reduce fat intake. However, TR remains elevated.   Initial consultation HPI 08/2020: Patient works for [employer]. He denies chest pain, shortness of breath, palpitations, leg edema, orthopnea, PND, TIA/syncope. He has not been as active physically through the pandemic. His lost his brother at 48 due to fatal MI. He has been on statin fo a long time, but continues to have elevaed TG.     Current Outpatient Medications on File Prior to Visit  Medication Sig Dispense Refill   aspirin (ASPIRIN LOW DOSE) 81 MG EC tablet Take 1 tablet (81 mg total) by mouth daily. Swallow whole. 90 tablet 3   cetirizine (ZYRTEC) 10 MG chewable tablet Chew 10 mg by mouth daily.     icosapent Ethyl (VASCEPA) 1 g capsule TAKE 2 CAPSULES(2 GRAMS) BY MOUTH TWICE DAILY 120 capsule 2   niacin 500 MG tablet Take 500 mg by mouth at bedtime.     Olmesartan-amLODIPine-HCTZ 40-10-25 MG TABS TAKE 1 TABLET BY MOUTH  DAILY 90 tablet 1   rosuvastatin (CRESTOR) 40 MG tablet Take 1 tablet (40 mg total) by mouth daily. 90 tablet 3   No current facility-administered medications on file prior to visit.    Cardiovascular and other pertinent studies:  EKG 08/30/2021: Sinus rhythm 77 bpm Normal EKG  Exercise Sestamibi Stress Test 08/21/2020: Normal ECG stress.  Patient reached 10.1 METS, and 97% of age predicted maximum heart rate. Exercise capacity was excellent. Chest pain not reported. Heart rate and hemodynamic response were normal. Stress EKG revealed no  ischemic changes. Myocardial perfusion is normal. Overall LV systolic function is normal without regional wall motion abnormalities. Stress LV EF: 53%.  No previous exam available for comparison. Low risk.   CT cardiac scoring 08/03/2020: Total score: 626 LM: 0 LAD: 399 LCx: 3 RCA: 224  Small hiatal hernia. Mild right middle lobe atelectasis.  Recent labs: 01/08/2022: Chol 144, TG 362, HDL 26, LDL 62  10/17/2021: Glucose 111, BUN/Cr 20/1.20. EGFR 68. Na/K 137/4.8. Rest of the CMP normal H/H 13/41. MCV 83. Platelets 292 HbA1C 6.9%  08/29/2021: Chol 152, TG 382, HDL 30, LDL 63  05/29/2021: Chol 176, TG 407, HDL 34, LDL 77  02/22/2021: Chol 186, TG 215, HDL 39, LDL 110  06/28/2020: Glucose 106, BUN/Cr 17/0.90. EGFR 92. Na/K 135/4.4. AST 42. Rest of the CMP normal H/H 14/44. MCV 88. Platelets 248 HbA1C 6.3% Chol 179, TG 384, HDL 31, LDL 85    Review of Systems  Cardiovascular:  Negative for chest pain, dyspnea on exertion, leg swelling, palpitations and syncope.        Vitals:   01/09/22 1320  BP: 114/68  Pulse: 85  Resp: 16  Temp: 98.4 F (36.9 C)  SpO2: 95%    Body mass index is 35.96 kg/m. Filed Weights   01/09/22 1320  Weight: 257 lb 12.8 oz (116.9 kg)     Objective:   Physical Exam Vitals and nursing note reviewed.  Constitutional:  General: He is not in acute distress. Neck:     Vascular: No JVD.  Cardiovascular:     Rate and Rhythm: Normal rate and regular rhythm.     Heart sounds: Normal heart sounds. No murmur heard. Pulmonary:     Effort: Pulmonary effort is normal.     Breath sounds: Normal breath sounds. No wheezing or rales.  Musculoskeletal:     Right lower leg: No edema.     Left lower leg: No edema.           Assessment & Recommendations:   61 y/o Caucasian male with hypertension, hyperlipidemia, prediabetes, obesity, family h/o premature CAD, elevated calcium score (08/2020), no ischemic on stress test  (09/2020)  CAD: Calcium score 626, including LM involvement. No ischemia on stress test (09/2020) Continue aggressive risk factor modification, including Aspirin, statin  Mixed hyperlipidemia: LDL 63, TG 382 on Crestor 40 mg, Vascepa 2  bid. Added Tricor 145 mg.  Stopped Niacin, as HDL remains low in spite of it.  Continue low carb low fat diet. Repeat lipid panel  Hypertension: Well controlled  F/u in 3 months after repeat lipid panel1/2023.   Nigel Mormon, MD Pager: [phone] Office: [phone]

## 2022-03-28 IMAGING — CR DG KNEE COMPLETE 4+V*L*
4 series · 4 of 4 positions shown · non-contrast
Comparison: None.

CLINICAL DATA: Left knee pain

EXAM:
LEFT KNEE - COMPLETE 4+ VIEW

[t knee ap left]
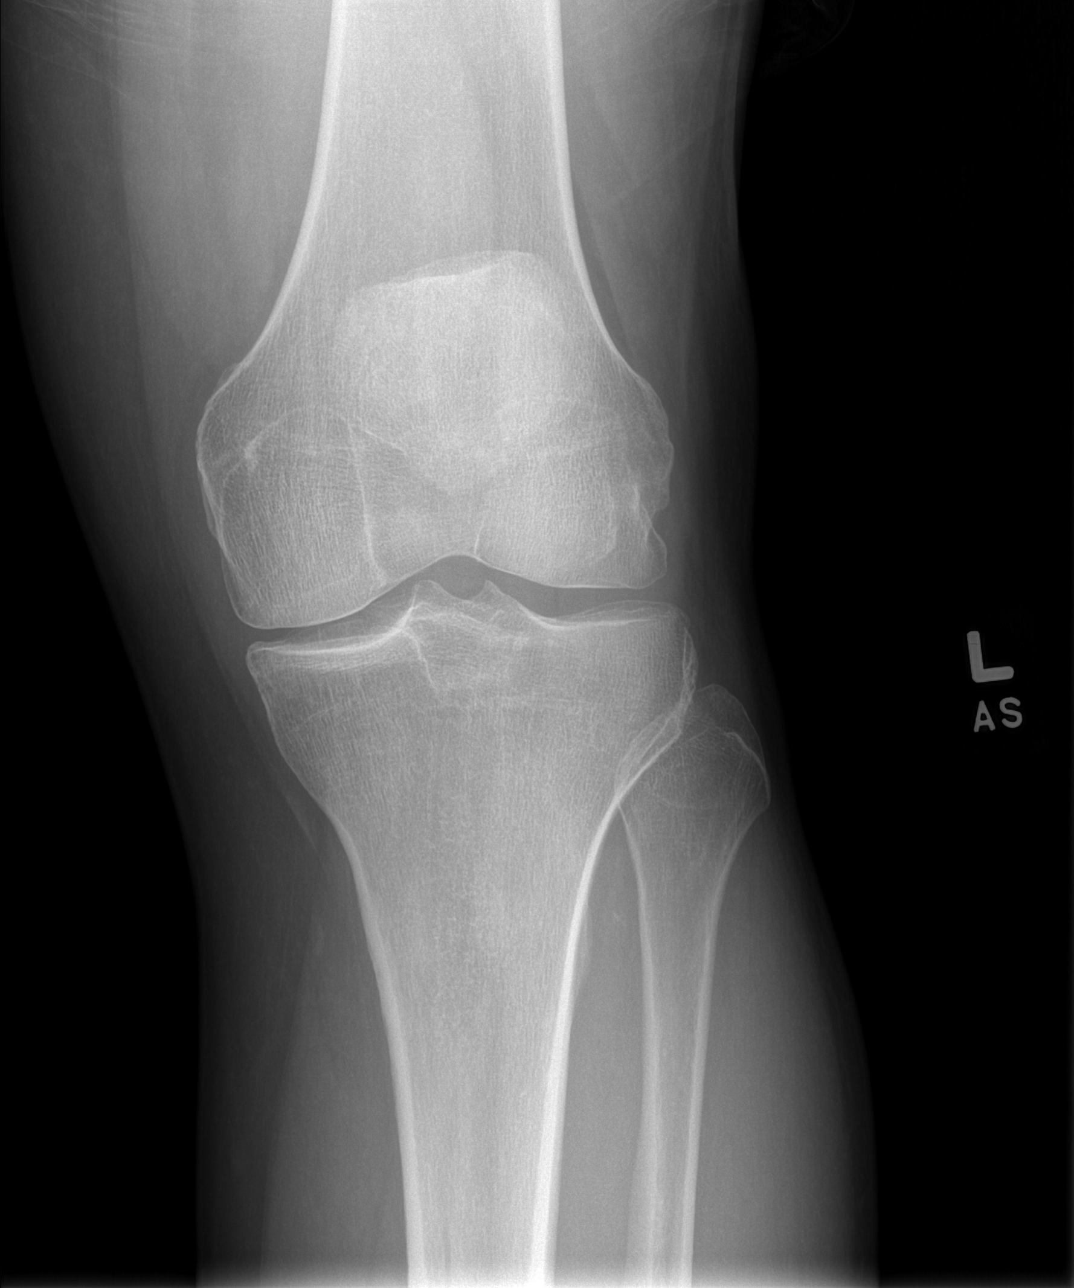

[t knee oblique left (1 of 2)]
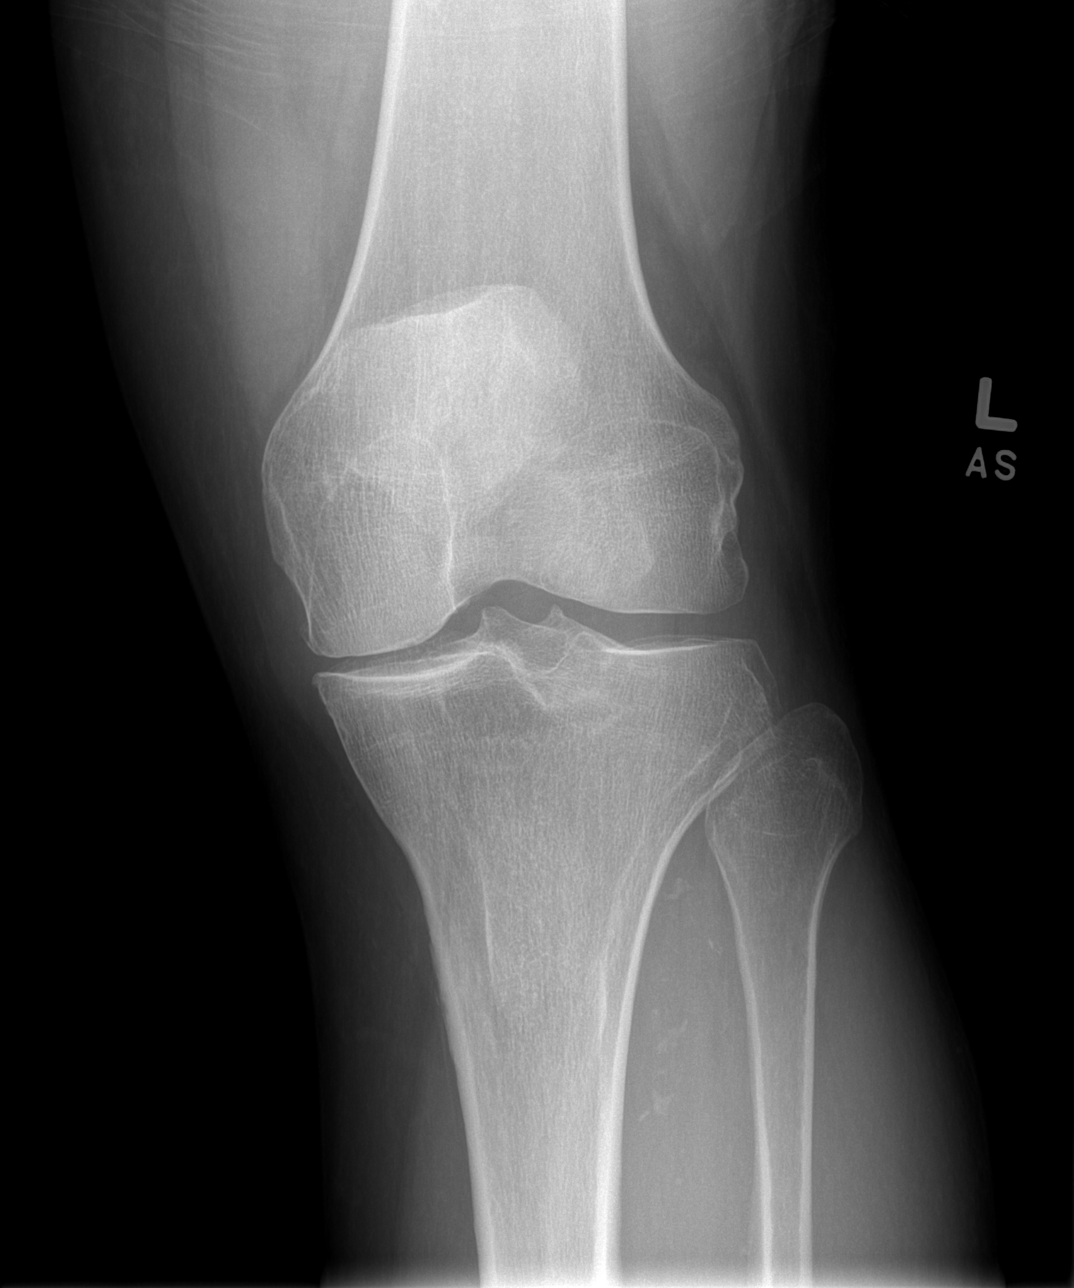

[t knee oblique left (2 of 2)]
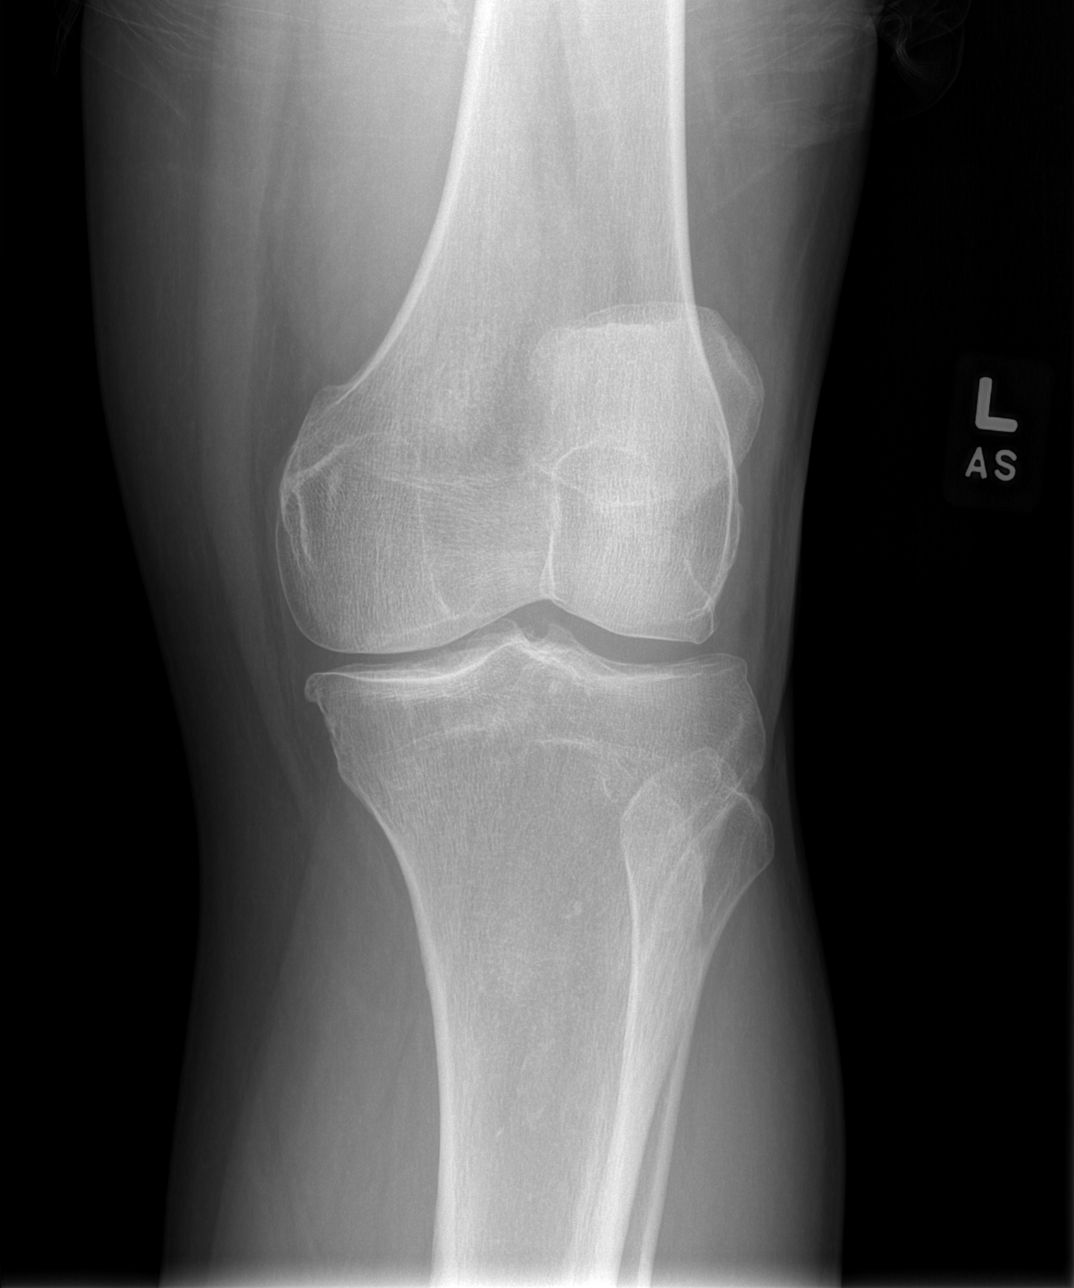

[t knee lat left]
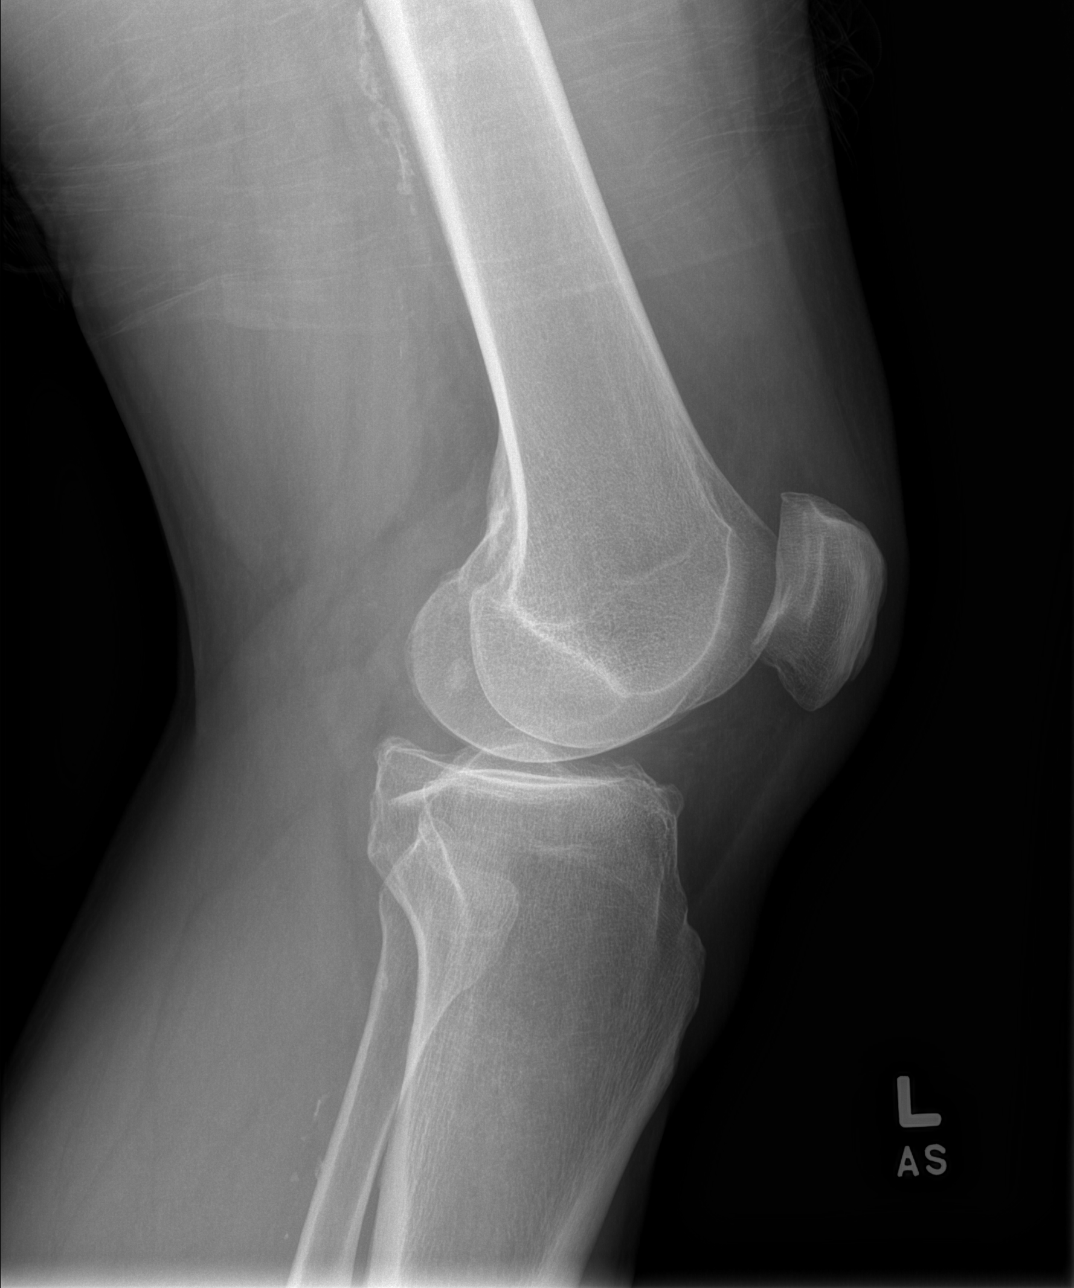

[4 of 4 positions shown; findings below may reference images not displayed]

FINDINGS: Early spurring and joint space narrowing within the left knee. No
joint effusion. No acute bony abnormality. Specifically, no
fracture, subluxation, or dislocation.
IMPRESSION: Early degenerative changes.  No acute bony abnormality.

## 2022-04-07 ENCOUNTER — Other Ambulatory Visit: Payer: Self-pay | Admitting: Cardiology

## 2022-04-07 DIAGNOSIS — I2510 Atherosclerotic heart disease of native coronary artery without angina pectoris: Secondary | ICD-10-CM

## 2022-04-11 ENCOUNTER — Ambulatory Visit: Payer: 59 | Admitting: Cardiology

## 2022-05-08 ENCOUNTER — Ambulatory Visit: Payer: Self-pay | Admitting: Cardiology

## 2022-05-08 LAB — LIPID PANEL
Chol/HDL Ratio: 4.8 ratio (ref 0.0–5.0)
Cholesterol, Total: 153 mg/dL (ref 100–199)
HDL: 32 mg/dL — ABNORMAL LOW (ref >39)
LDL Chol Calc (NIH): 83 mg/dL (ref 0–99)
Triglycerides: 225 mg/dL — ABNORMAL HIGH (ref 0–149)
VLDL Cholesterol Cal: 38 mg/dL (ref 5–40)

## 2022-05-08 LAB — LDL CHOLESTEROL, DIRECT: LDL Direct: 73 mg/dL (ref 0–99)

## 2022-05-08 NOTE — Progress Notes (Signed)
Error

## 2022-05-15 ENCOUNTER — Other Ambulatory Visit: Payer: Self-pay | Admitting: Cardiology

## 2022-05-15 DIAGNOSIS — E782 Mixed hyperlipidemia: Secondary | ICD-10-CM

## 2022-05-15 DIAGNOSIS — E781 Pure hyperglyceridemia: Secondary | ICD-10-CM

## 2022-05-17 ENCOUNTER — Other Ambulatory Visit: Payer: Self-pay | Admitting: Medical

## 2022-05-22 ENCOUNTER — Ambulatory Visit: Payer: 59 | Admitting: Cardiology

## 2022-05-22 ENCOUNTER — Encounter: Payer: Self-pay | Admitting: Cardiology

## 2022-05-22 VITALS — BP 125/75 | HR 85 | Temp 98.0°F | Resp 17 | Ht 71.0 in | Wt 258.0 lb

## 2022-05-22 DIAGNOSIS — E782 Mixed hyperlipidemia: Secondary | ICD-10-CM

## 2022-05-22 DIAGNOSIS — I2510 Atherosclerotic heart disease of native coronary artery without angina pectoris: Secondary | ICD-10-CM

## 2022-05-22 NOTE — Progress Notes (Signed)
Patient referred by Carlena Hurl, PA-C for screening for CAD  Subjective:   Steven Fletcher, male    DOB: 09-08-1961, 61 y.o.   MRN: [id number]   Chief Complaint  Patient presents with   Hyperlipidemia    3 MONTH   Follow-up     HPI  61 y/o Caucasian male with hypertension, hyperlipidemia, prediabetes, obesity, family h/o premature CAD  Patient is doing well, walks up to 3-4 miles without any symptoms. He denies chest pain, shortness of breath, palpitations, leg edema, orthopnea, PND, TIA/syncope. Reviewed recent test results with the patient, details below.    Initial consultation HPI 08/2020: Patient works for [employer]. He denies chest pain, shortness of breath, palpitations, leg edema, orthopnea, PND, TIA/syncope. He has not been as active physically through the pandemic. His lost his brother at 30 due to fatal MI. He has been on statin fo a long time, but continues to have elevaed TG.     Current Outpatient Medications:    aspirin (ASPIRIN LOW DOSE) 81 MG EC tablet, Take 1 tablet (81 mg total) by mouth daily. Swallow whole., Disp: 90 tablet, Rfl: 3   cetirizine (ZYRTEC) 10 MG chewable tablet, Chew 10 mg by mouth daily., Disp: , Rfl:    fenofibrate (TRICOR) 145 MG tablet, TAKE 1 TABLET(145 MG) BY MOUTH DAILY, Disp: 30 tablet, Rfl: 3   icosapent Ethyl (VASCEPA) 1 g capsule, TAKE 2 CAPSULES(2 GRAMS) BY MOUTH TWICE DAILY, Disp: 120 capsule, Rfl: 2   Olmesartan-amLODIPine-HCTZ 40-10-25 MG TABS, TAKE 1 TABLET BY MOUTH DAILY, Disp: 90 tablet, Rfl: 0   rosuvastatin (CRESTOR) 40 MG tablet, TAKE 1 TABLET(40 MG) BY MOUTH DAILY, Disp: 90 tablet, Rfl: 3    Cardiovascular and other pertinent studies:  EKG 05/22/2022: Sinus rhythm 79 bpm Normal EKG  Exercise Sestamibi Stress Test 08/21/2020: Normal ECG stress.  Patient reached 10.1 METS, and 97% of age predicted maximum heart rate. Exercise capacity was excellent. Chest pain not reported. Heart rate and hemodynamic  response were normal. Stress EKG revealed no ischemic changes. Myocardial perfusion is normal. Overall LV systolic function is normal without regional wall motion abnormalities. Stress LV EF: 53%.  No previous exam available for comparison. Low risk.   CT cardiac scoring 08/03/2020: Total score: 626 LM: 0 LAD: 399 LCx: 3 RCA: 224  Small hiatal hernia. Mild right middle lobe atelectasis.  Recent labs: 05/07/2022: Chol 153, TG 225, HDL 32, LDL 83  01/08/2022: Chol 144, TG 362, HDL 26, LDL 62  10/17/2021: Glucose 111, BUN/Cr 20/1.20. EGFR 68. Na/K 137/4.8. Rest of the CMP normal H/H 13/41. MCV 83. Platelets 292 HbA1C 6.9%  08/29/2021: Chol 152, TG 382, HDL 30, LDL 63  05/29/2021: Chol 176, TG 407, HDL 34, LDL 77  02/22/2021: Chol 186, TG 215, HDL 39, LDL 110  06/28/2020: Glucose 106, BUN/Cr 17/0.90. EGFR 92. Na/K 135/4.4. AST 42. Rest of the CMP normal H/H 14/44. MCV 88. Platelets 248 HbA1C 6.3% Chol 179, TG 384, HDL 31, LDL 85    Review of Systems  Cardiovascular:  Negative for chest pain, dyspnea on exertion, leg swelling, palpitations and syncope.         Vitals:   05/22/22 1449  BP: 125/75  Pulse: 85  Resp: 17  Temp: 98 F (36.7 C)  SpO2: 98%    Body mass index is 35.98 kg/m. Filed Weights   05/22/22 1449  Weight: 258 lb (117 kg)     Objective:   Physical Exam Vitals  and nursing note reviewed.  Constitutional:      General: He is not in acute distress. Neck:     Vascular: No JVD.  Cardiovascular:     Rate and Rhythm: Normal rate and regular rhythm.     Heart sounds: Normal heart sounds. No murmur heard. Pulmonary:     Effort: Pulmonary effort is normal.     Breath sounds: Normal breath sounds. No wheezing or rales.  Musculoskeletal:     Right lower leg: No edema.     Left lower leg: No edema.           Assessment & Recommendations:   61 y/o Caucasian male with hypertension, hyperlipidemia, prediabetes, obesity, family h/o premature  CAD, elevated calcium score (08/2020), no ischemic on stress test (09/2020)  CAD: Calcium score 626, including LM involvement. No ischemia on stress test (09/2020) Continue aggressive risk factor modification, including Aspirin, statin  Mixed hyperlipidemia: Chol 153, TG 225, HDL 32, LDL 83 (05/2022) Currently on Crestor 40 mg, Vascepa 2  bid, Tricor 145 mg.  Continue the same. Recommend low carb low fat diet. Repeat fasting lipid panel and direct LDL.  Hypertension: Well controlled  F/u in 6 months   Nigel Mormon, MD Pager: [phone] Office: [phone]

## 2022-06-05 ENCOUNTER — Ambulatory Visit: Payer: 59 | Admitting: Physician Assistant

## 2022-06-05 ENCOUNTER — Encounter: Payer: Self-pay | Admitting: Physician Assistant

## 2022-06-05 DIAGNOSIS — M1712 Unilateral primary osteoarthritis, left knee: Secondary | ICD-10-CM | POA: Diagnosis not present

## 2022-06-05 NOTE — Progress Notes (Signed)
HPI: [name] comes in today for left knee pain.  He has known left knee osteoarthritis.  Wants to get a gel injection again.  He was told he needed office visit prior to this being arthritis.  Said no new injury to the knee.  Pains medial aspect of the knee where he has moderate to moderately severe narrowing.  Notes his pain is 4-5 out of 10 pain at worst.  Does use Voltaren gel which helps some.  Feels like the gel injection helped for approximately 6 months.   Review of systems see HPI otherwise negative noncontributory.  Physical exam: General well-developed well-nourished male no acute distress mood affect appropriate.  Ambulates without any assistive device.  Bilateral knees: No abnormal warmth erythema or effusion.  Good range of motion bilateral knees patellofemoral crepitus bilaterally.  Slight tenderness along medial joint line the left knee.  No instability valgus varus stressing of either knee.  Impression: Left knee osteoarthritis  Plan: He will continue work on quad strengthening.  Continue his Voltaren gel.  We will have him back once supplemental injections been approved.  Questions were encouraged and answered at length.

## 2022-07-01 ENCOUNTER — Telehealth: Payer: Self-pay

## 2022-07-01 NOTE — Telephone Encounter (Signed)
VOB submitted for Durolane, left knee. °BV pending. °

## 2022-07-14 ENCOUNTER — Telehealth: Payer: Self-pay

## 2022-07-14 ENCOUNTER — Ambulatory Visit
Admission: EM | Admit: 2022-07-14 | Discharge: 2022-07-14 | Disposition: A | Discharge status: Home / Self Care | Payer: 59 | Attending: Emergency Medicine | Admitting: Emergency Medicine

## 2022-07-14 DIAGNOSIS — U071 COVID-19: Secondary | ICD-10-CM | POA: Diagnosis not present

## 2022-07-14 MED ORDER — PROMETHAZINE-DM 6.25-15 MG/5ML PO SYRP
5.0000 mL | ORAL_SOLUTION | Freq: Four times a day (QID) | ORAL | 0 refills | Status: DC | PRN
Start: 2022-07-14 — End: 2022-08-01

## 2022-07-14 MED ORDER — NIRMATRELVIR/RITONAVIR (PAXLOVID)TABLET: 3.0000 | ORAL_TABLET | Freq: Two times a day (BID) | ORAL | 0 refills | Status: AC

## 2022-07-14 MED ORDER — PROMETHAZINE-DM 6.25-15 MG/5ML PO SYRP: 5.0000 mL | ORAL_SOLUTION | Freq: Four times a day (QID) | ORAL | 0 refills | Status: DC | PRN

## 2022-07-14 MED ORDER — NIRMATRELVIR/RITONAVIR (PAXLOVID)TABLET: 3.0000 | ORAL_TABLET | Freq: Two times a day (BID) | ORAL | 0 refills | Status: DC

## 2022-07-14 NOTE — Telephone Encounter (Signed)
Pt medication sent to the correct pharmacy, patient was made aware (Patient verification complete [name and date of birth]).

## 2022-07-14 NOTE — ED Triage Notes (Addendum)
The patient states this morning his at home covid test was positive.   Symptoms started Friday: cough, chills, fever, body aches, congestion, headache   Home interventions: mucinex, advil

## 2022-07-14 NOTE — ED Provider Notes (Signed)
[facility]    CSN: [id number] Arrival date & time: 07/14/22  0849    HISTORY   Chief Complaint  Patient presents with   Cough   Generalized Body Aches   Nasal Congestion   Headache   HPI Steven Fletcher is a pleasant, 61 y.o. male who presents to urgent care today. Patient states that 3 days ago he began to have cough, chills, fever, body aches, nasal congestion, sinus pressure and sinus headache.  Patient states has been taking Advil and Tylenol with minimal relief.  Patient states that this morning he decided to take a home COVID-19 test at the encouragement of his sister, patient states he was positive.  Patient denies history of allergies, asthma, smoking.  Patient states he is fully vaccinated for COVID-19.  States the cough is mildly productive of sputum and is much worse at night.  Oxygen saturation is 95% today.  Patient has a mildly elevated temperature of nine 9.6.  Patient's last GFR was in November 2022 and was 23.  The history is provided by the patient.   Past Medical History:  Diagnosis Date   Agatston coronary artery calcium score greater than 400 08/21/2020   Allergy    seasonal    Arthritis of left knee    Back pain    intermittent pain, sees Dr. Limmie Patricia, Chiropractic   Family history of premature CAD    Hypertension    Impaired fasting blood sugar    Mixed dyslipidemia 2005   hereditary   Obesity    Wears glasses    Patient Active Problem List   Diagnosis Date Noted   Hypertriglyceridemia 05/30/2021   Mixed hyperlipidemia 02/26/2021   Coronary artery disease involving native coronary artery of native heart without angina pectoris 02/25/2021   Elevated coronary artery calcium score 88/82/8003   Metabolic syndrome 49/17/9150   Screening for prostate cancer 06/28/2020   Plantar fasciitis 06/28/2020   Diastasis recti 12/04/2016   Deviation of finger of left hand 12/04/2016   Screen for colon cancer 12/04/2016   Vaccine counseling 12/04/2016    Ganglion cyst 07/08/2016   Low testosterone 07/04/2016   Hypogonadism in male 07/04/2016   Encounter for health maintenance examination in adult 07/04/2016   Essential hypertension 06/07/2015   Dyslipidemia 11/10/2014   Impaired fasting blood sugar 11/10/2014   Obesity 11/10/2014   Family history of premature CAD 11/10/2014   Past Surgical History:  Procedure Laterality Date   COLONOSCOPY  2010   [place]; normal, repeat 2020   COLONOSCOPY  06/2019   benign polyps, diverticulosis, Dr. Harl Bowie, repeat 10 years   elbow Right    arthroscopic for cartilege 1980, 1984, [place]   move nerve   KNEE CARTILAGE SURGERY Bilateral 2003, 2005     x 2, R 2005, L 2003   TONSILLECTOMY     WISDOM TOOTH EXTRACTION     wrist sugery Right 2000    bone spur    Home Medications    Prior to Admission medications   Medication Sig Start Date End Date Taking? Authorizing Provider  nirmatrelvir/ritonavir EUA (PAXLOVID) 20 x 150 MG & 10 x '100MG'$  TABS Take 3 tablets by mouth 2 (two) times daily for 5 days. Patient GFR is 48. Take nirmatrelvir (150 mg) two tablets twice daily for 5 days and ritonavir (100 mg) one tablet twice daily for 5 days. 07/14/22 07/19/22 Yes Lynden Oxford Scales, PA-C  promethazine-dextromethorphan (PROMETHAZINE-DM) 6.25-15 MG/5ML syrup Take 5  mLs by mouth 4 (four) times daily as needed for cough. 07/14/22  Yes Lynden Oxford Scales, PA-C  aspirin (ASPIRIN LOW DOSE) 81 MG EC tablet Take 1 tablet (81 mg total) by mouth daily. Swallow whole. 06/29/20   Tysinger, Camelia Eng, PA-C  cetirizine (ZYRTEC) 10 MG chewable tablet Chew 10 mg by mouth daily.    [provider]  fenofibrate (TRICOR) 145 MG tablet TAKE 1 TABLET(145 MG) BY MOUTH DAILY 05/16/22   Patwardhan, Reynold Bowen, MD  icosapent Ethyl (VASCEPA) 1 g capsule TAKE 2 CAPSULES(2 GRAMS) BY MOUTH TWICE DAILY 04/08/22   Patwardhan, Reynold Bowen, MD  Olmesartan-amLODIPine-HCTZ 40-10-25 MG TABS TAKE 1 TABLET BY  MOUTH DAILY 05/20/22   Tysinger, Camelia Eng, PA-C  rosuvastatin (CRESTOR) 40 MG tablet TAKE 1 TABLET(40 MG) BY MOUTH DAILY 05/16/22   Patwardhan, Reynold Bowen, MD    Family History Family History  Problem Relation Age of Onset   Alzheimer's disease Mother    Hyperlipidemia Mother    Hypertension Father    Other Father        died of old age   72 Sister        breast   Hyperlipidemia Sister    Heart disease Brother 84       died of MI   Hyperlipidemia Brother    Diabetes Maternal Grandmother    Diabetes Paternal Grandmother    Stroke Neg Hx    Colon cancer Neg Hx    Colon polyps Neg Hx    Esophageal cancer Neg Hx    Ulcerative colitis Neg Hx    Stomach cancer Neg Hx    Rectal cancer Neg Hx    Social History Social History   Tobacco Use   Smoking status: Never   Smokeless tobacco: Never  Vaping Use   Vaping Use: Never used  Substance Use Topics   Alcohol use: Yes    Alcohol/week: 3.0 standard drinks of alcohol    Types: 3 Cans of beer per week    Comment: occasional   Drug use: No   Allergies   Patient has no known allergies.  Review of Systems Review of Systems Pertinent findings revealed after performing a 14 point review of systems has been noted in the history of present illness.  Physical Exam Triage Vital Signs ED Triage Vitals  Enc Vitals Group     BP 09/28/21 0827 (!) 147/82     Pulse Rate 09/28/21 0827 72     Resp 09/28/21 0827 18     Temp 09/28/21 0827 98.3 F (36.8 C)     Temp Source 09/28/21 0827 Oral     SpO2 09/28/21 0827 98 %     Weight --      Height --      Head Circumference --      Peak Flow --      Pain Score 09/28/21 0826 5     Pain Loc --      Pain Edu? --      Excl. in [place]? --   No data found.  Updated Vital Signs BP 105/71 (BP Location: Right Arm)   Pulse 91   Temp 99.6 F (37.6 C) (Oral)   Resp 18   SpO2 95%   Physical Exam Vitals and nursing note reviewed.  Constitutional:      General: He is awake. He is not in acute  distress.    Appearance: Normal appearance. He is well-developed and well-groomed. He is obese. He is ill-appearing.  HENT:     Head: Normocephalic and atraumatic.     Salivary Glands: Right salivary gland is not diffusely enlarged or tender. Left salivary gland is not diffusely enlarged or tender.     Right Ear: Tympanic membrane, ear canal and external ear normal. No drainage. No middle ear effusion. There is no impacted cerumen. Tympanic membrane is not erythematous or bulging.     Left Ear: Tympanic membrane, ear canal and external ear normal. No drainage.  No middle ear effusion. There is no impacted cerumen. Tympanic membrane is not erythematous or bulging.     Nose: Nose normal. No nasal deformity, septal deviation, mucosal edema, congestion or rhinorrhea.     Right Turbinates: Not enlarged, swollen or pale.     Left Turbinates: Not enlarged, swollen or pale.     Right Sinus: No maxillary sinus tenderness or frontal sinus tenderness.     Left Sinus: No maxillary sinus tenderness or frontal sinus tenderness.     Mouth/Throat:     Lips: Pink. No lesions.     Mouth: Mucous membranes are moist. No oral lesions.     Pharynx: Oropharynx is clear. Uvula midline. No posterior oropharyngeal erythema or uvula swelling.     Tonsils: No tonsillar exudate. 0 on the right. 0 on the left.  Eyes:     General: Lids are normal.        Right eye: No discharge.        Left eye: No discharge.     Extraocular Movements: Extraocular movements intact.     Conjunctiva/sclera: Conjunctivae normal.     Right eye: Right conjunctiva is not injected.     Left eye: Left conjunctiva is not injected.  Neck:     Trachea: Trachea and phonation normal.  Cardiovascular:     Rate and Rhythm: Normal rate and regular rhythm.     Pulses: Normal pulses.     Heart sounds: Normal heart sounds. No murmur heard.    No friction rub. No gallop.  Pulmonary:     Effort: Pulmonary effort is normal. No accessory muscle usage,  prolonged expiration or respiratory distress.     Breath sounds: Normal breath sounds. No stridor, decreased air movement or transmitted upper airway sounds. No decreased breath sounds, wheezing, rhonchi or rales.  Chest:     Chest wall: No tenderness.  Musculoskeletal:        General: Normal range of motion.     Cervical back: Normal range of motion and neck supple. Normal range of motion.  Lymphadenopathy:     Cervical: No cervical adenopathy.  Skin:    General: Skin is warm and dry.     Findings: No erythema or rash.  Neurological:     General: No focal deficit present.     Mental Status: He is alert and oriented to person, place, and time.  Psychiatric:        Mood and Affect: Mood normal.        Behavior: Behavior normal. Behavior is cooperative.     Visual Acuity Right Eye Distance:   Left Eye Distance:   Bilateral Distance:    Right Eye Near:   Left Eye Near:    Bilateral Near:     UC Couse / Diagnostics / Procedures:     Radiology No results found.  Procedures Procedures (including critical care time) EKG  Pending results:  Labs Reviewed - No data to display  Medications Ordered in UC: Medications - No data to display  UC Diagnoses / Final Clinical Impressions(s)   I have reviewed the triage vital signs and the nursing notes.  Pertinent labs & imaging results that were available during my care of the patient were reviewed by me and considered in my medical decision making (see chart for details).    Final diagnoses:  [id number]   Patient was provided with Paxlovid and advised to discontinue rosuvastatin and cut his combination blood pressure pill in half due to it containing amlodipine.  Patient was advised to resume both medications at regular doses after completing Paxlovid.  Return precautions advised.  Emergency precautions advised.  ED Prescriptions     Medication Sig Dispense Auth. Provider   nirmatrelvir/ritonavir EUA (PAXLOVID) 20 x 150 MG & 10  x '100MG'$  TABS Take 3 tablets by mouth 2 (two) times daily for 5 days. Patient GFR is 48. Take nirmatrelvir (150 mg) two tablets twice daily for 5 days and ritonavir (100 mg) one tablet twice daily for 5 days. 30 tablet Lynden Oxford Scales, PA-C   promethazine-dextromethorphan (PROMETHAZINE-DM) 6.25-15 MG/5ML syrup Take 5 mLs by mouth 4 (four) times daily as needed for cough. 180 mL Lynden Oxford Scales, PA-C      PDMP not reviewed this encounter.  Disposition Upon Discharge:  Condition: stable for discharge home Home: take medications as prescribed; routine discharge instructions as discussed; follow up as advised.  Patient presented with an acute illness with associated systemic symptoms and significant discomfort requiring urgent management. In my opinion, this is a condition that a prudent lay person (someone who possesses an average knowledge of health and medicine) may potentially expect to result in complications if not addressed urgently such as respiratory distress, impairment of bodily function or dysfunction of bodily organs.   Routine symptom specific, illness specific and/or disease specific instructions were discussed with the patient and/or caregiver at length.   As such, the patient has been evaluated and assessed, work-up was performed and treatment was provided in alignment with urgent care protocols and evidence based medicine.  Patient/parent/caregiver has been advised that the patient may require follow up for further testing and treatment if the symptoms continue in spite of treatment, as clinically indicated and appropriate.  If the patient was tested for COVID-19, Influenza and/or RSV, then the patient/parent/guardian was advised to isolate at home pending the results of his/her diagnostic coronavirus test and potentially longer if they're positive. I have also advised pt that if his/her COVID-19 test returns positive, it's recommended to self-isolate for at least 10 days  after symptoms first appeared AND until fever-free for 24 hours without fever reducer AND other symptoms have improved or resolved. Discussed self-isolation recommendations as well as instructions for household member/close contacts as per the [facility] and [place] DHHS, and also gave patient the [place] packet with this information.  Patient/parent/caregiver has been advised to return to the [facility] or PCP in 3-5 days if no better; to PCP or the Emergency Department if new signs and symptoms develop, or if the current signs or symptoms continue to change or worsen for further workup, evaluation and treatment as clinically indicated and appropriate  The patient will follow up with their current PCP if and as advised. If the patient does not currently have a PCP we will assist them in obtaining one.   The patient may need specialty follow up if the symptoms continue, in spite of conservative treatment and management, for further workup, evaluation, consultation and treatment as clinically indicated and appropriate.  Patient/parent/caregiver verbalized understanding and  agreement of plan as discussed.  All questions were addressed during visit.  Please see discharge instructions below for further details of plan.  Discharge Instructions:   Discharge Instructions      Please read the handout provided regarding COVID-19, self-care at home and prevention.  I have sent a prescription for Paxlovid to your pharmacy, please begin taking it today if at all possible.  I also sent a prescription for cough medicine coughed Promethazine DM that you can take 3 times daily to reduce your cough.  It is also an effective medicine for sleep at night so if you prefer to take it at night only please feel free to do so.  Other over-the-counter remedies such as ibuprofen and Tylenol, which you have already tried recommended.  1 personal recommendation is guaifenesin which is sold under the brand names of Robitussin and Mucinex, this  is an expectorant that helps break up chest congestion making coughing easier and more productive.  If you have worsening symptoms of shortness of breath, temperature greater than 102.5, oxygen saturation below 90, confusion or chest pain please call 911 to bring the emergency room to you.  As we discussed, you are no longer required to isolate at home having had symptoms for greater than 72 hours but it is recommended that you continue to wear a mask around other people for the next 7 days.  Thank you for visiting urgent care today.  We hope you feel better soon.    This office note has been dictated using Museum/gallery curator.  Unfortunately, this method of dictation can sometimes lead to typographical or grammatical errors.  I apologize for your inconvenience in advance if this occurs.  Please do not hesitate to reach out to me if clarification is needed.      Lynden Oxford Scales, [place] 07/14/22 [phone]

## 2022-07-14 NOTE — Discharge Instructions (Addendum)
Please read the handout provided regarding COVID-19, self-care at home and prevention.  I have sent a prescription for Paxlovid to your pharmacy, please begin taking it today if at all possible.  While you are taking Paxlovid, it is advisable that you do not take rosuvastatin and that you break your blood pressure medication in half, both due to drug to drug interaction that causes increased blood concentrations of rosuvastatin and amlodipine when taking Paxlovid.  Once you discontinue Paxlovid you can resume both of these medications safely.  I also sent a prescription for cough medicine coughed Promethazine DM that you can take 3 times daily to reduce your cough.  It is also an effective medicine for sleep at night so if you prefer to take it at night only please feel free to do so.  Other over-the-counter remedies such as ibuprofen and Tylenol, which you have already tried recommended.  1 personal recommendation is guaifenesin which is sold under the brand names of Robitussin and Mucinex, this is an expectorant that helps break up chest congestion making coughing easier and more productive.  If you have worsening symptoms of shortness of breath, temperature greater than 102.5, oxygen saturation below 90, confusion or chest pain please call 911 to bring the emergency room to you.  As we discussed, you are no longer required to isolate at home having had symptoms for greater than 72 hours but it is recommended that you continue to wear a mask around other people for the next 7 days.  Thank you for visiting urgent care today.  We hope you feel better soon.

## 2022-07-26 ENCOUNTER — Other Ambulatory Visit: Payer: Self-pay | Admitting: Cardiology

## 2022-07-26 DIAGNOSIS — I2510 Atherosclerotic heart disease of native coronary artery without angina pectoris: Secondary | ICD-10-CM

## 2022-07-29 NOTE — Telephone Encounter (Signed)
Plan covers at 85% of allowable amount for Durolane [id number] and 100% of allowable amount for procedure 20610/20611. Deductibles must be met before the coverage applies. Patient has a $40 copay whether or not an office visit is billed. Only one copay applies per date of service. If out of pocket is met, coverage goes to 100% and copay will no longer apply. No pre-cert and referrals needed  Approved for Durolane Covered @ 85% $40 copay required No prior auth required  Ok to schedule 

## 2022-07-29 NOTE — Telephone Encounter (Signed)
Please see message from pt.

## 2022-08-01 ENCOUNTER — Ambulatory Visit: Payer: 59 | Admitting: Physician Assistant

## 2022-08-01 ENCOUNTER — Encounter: Payer: Self-pay | Admitting: Physician Assistant

## 2022-08-01 DIAGNOSIS — M1712 Unilateral primary osteoarthritis, left knee: Secondary | ICD-10-CM | POA: Diagnosis not present

## 2022-08-01 MED ORDER — LIDOCAINE HCL 1 % IJ SOLN
3.0000 mL | INTRAMUSCULAR | Status: AC | PRN
  Administered 2022-08-01: 3 mL

## 2022-08-01 MED ORDER — METHYLPREDNISOLONE ACETATE 40 MG/ML IJ SUSP
40.0000 mg | INTRAMUSCULAR | Status: AC | PRN
  Administered 2022-08-01: 40 mg via INTRA_ARTICULAR

## 2022-08-01 NOTE — Progress Notes (Signed)
   Procedure Note  Patient: Steven Fletcher             Date of Birth: 13-May-1961           MRN: [id number]             Visit Date: 08/01/2022 Steven Fletcher comes in today for scheduled supplemental injection in his left knee.  He said no change in overall pain of the knee.  But this morning more instant relief and is asking if he can have a cortisone injection.  He has known osteoarthritis of the knee.  Review of systems: Negative for fevers or chills  Physical exam: Left knee no abnormal warmth, effusion or erythema.  Good range of motion of the knee.  Procedures: Visit Diagnoses:  1. Primary osteoarthritis of left knee     Large Joint Inj: L knee on 08/01/2022 4:21 PM Indications: pain Details: 22 G 1.5 in needle, anterolateral approach  Arthrogram: No  Medications: 3 mL lidocaine 1 %; 40 mg methylPREDNISolone acetate 40 MG/ML Outcome: tolerated well, no immediate complications Procedure, treatment alternatives, risks and benefits explained, specific risks discussed. Consent was given by the patient. Immediately prior to procedure a time out was called to verify the correct patient, procedure, equipment, support staff and site/side marked as required. Patient was prepped and draped in the usual sterile fashion.    Plan: We will see him back in 12 days for supplemental injection with Durolane.  Questions were encouraged and answered.

## 2022-08-07 ENCOUNTER — Encounter: Payer: Self-pay | Admitting: Internal Medicine

## 2022-08-08 ENCOUNTER — Other Ambulatory Visit: Payer: Self-pay | Admitting: Medical

## 2022-08-19 ENCOUNTER — Ambulatory Visit: Payer: 59 | Admitting: Physician Assistant

## 2022-08-19 ENCOUNTER — Encounter: Payer: Self-pay | Admitting: Physician Assistant

## 2022-08-19 DIAGNOSIS — M1712 Unilateral primary osteoarthritis, left knee: Secondary | ICD-10-CM

## 2022-08-19 MED ORDER — METHYLPREDNISOLONE ACETATE 40 MG/ML IJ SUSP
40.0000 mg | INTRAMUSCULAR | Status: AC | PRN
  Administered 2022-08-19: 40 mg via INTRA_ARTICULAR

## 2022-08-19 MED ORDER — SODIUM HYALURONATE 60 MG/3ML IX PRSY
60.0000 mg | PREFILLED_SYRINGE | INTRA_ARTICULAR | Status: AC | PRN
  Administered 2022-08-19: 60 mg via INTRA_ARTICULAR

## 2022-08-19 MED ORDER — LIDOCAINE HCL 1 % IJ SOLN
3.0000 mL | INTRAMUSCULAR | Status: AC | PRN
  Administered 2022-08-19: 3 mL

## 2022-08-19 NOTE — Progress Notes (Signed)
   Procedure Note  Patient: Steven Fletcher             Date of Birth: 09/10/1961           MRN: [id number]             Visit Date: 08/19/2022 HPI: Mr.Piedra comes in today for scheduled Durolane injection left knee.  He denies any injuries to the left knee.  He is taking no medications.  He has no scheduled surgery for the left knee.  He has tried other conservative measures and continues to have pain in the left knee.  He has known osteoarthritis of his left knee.  Physical exam: Left knee full extension full flexion no abnormal warmth erythema or effusion Procedures: Visit Diagnoses:  1. Primary osteoarthritis of left knee     Large Joint Inj: L knee on 08/19/2022 5:34 PM Indications: pain Details: 22 G 1.5 in needle, superolateral approach  Arthrogram: No  Medications: 3 mL lidocaine 1 %; 40 mg methylPREDNISolone acetate 40 MG/ML; 60 mg Sodium Hyaluronate 60 MG/3ML Outcome: tolerated well, no immediate complications Procedure, treatment alternatives, risks and benefits explained, specific risks discussed. Consent was given by the patient. Immediately prior to procedure a time out was called to verify the correct patient, procedure, equipment, support staff and site/side marked as required. Patient was prepped and draped in the usual sterile fashion.     Plan: He knows he needs to wait at least 6 months between supplemental injections.  He will follow-up with [place] as needed.

## 2022-09-09 ENCOUNTER — Ambulatory Visit: Payer: 59 | Admitting: Medical

## 2022-09-09 VITALS — BP 110/68 | HR 84 | Temp 98.6°F | Wt 256.4 lb

## 2022-09-09 DIAGNOSIS — J3489 Other specified disorders of nose and nasal sinuses: Secondary | ICD-10-CM | POA: Diagnosis not present

## 2022-09-09 DIAGNOSIS — Z8616 Personal history of COVID-19: Secondary | ICD-10-CM | POA: Diagnosis not present

## 2022-09-09 DIAGNOSIS — J329 Chronic sinusitis, unspecified: Secondary | ICD-10-CM | POA: Diagnosis not present

## 2022-09-09 MED ORDER — PREDNISONE 10 MG PO TABS: ORAL_TABLET | ORAL | 0 refills | Status: DC

## 2022-09-09 MED ORDER — AMOXICILLIN 875 MG PO TABS: 875.0000 mg | ORAL_TABLET | Freq: Two times a day (BID) | ORAL | 0 refills | Status: AC

## 2022-09-09 MED ORDER — EMERGEN-C IMMUNE PLUS PO PACK: 1.0000 | PACK | Freq: Two times a day (BID) | ORAL | 0 refills | Status: DC

## 2022-09-09 NOTE — Progress Notes (Signed)
Subjective:  Steven Fletcher is a 61 y.o. male who presents for Chief Complaint  Patient presents with   covid back in august    Covid back in august- drainage and runny nose, cough, mucous and can't get rid of it.      Here for lingering symptoms since being treated for covid infection in August over a month ago.    Current symptoms include crud in throat, cough, drainage comes and goes, sometimes clear, then sometimes feels lots of drainage.   Mucous at times green from nose.   No wheezing or sob, but sometimes hears a sound in chest/throat like a wheeze.    Initially had a week out of work for covid infection and lots of fatigue.  Was seen through urgent care.  Initial symptoms were chills.  Then graduated to flu like symptoms.   Did paxlovid and cough medication.    Currently using mucinex DM.  This helps some.  No other aggravating or relieving factors.    No other c/o.  Past Medical History:  Diagnosis Date   Agatston coronary artery calcium score greater than 400 08/21/2020   Allergy    seasonal    Arthritis of left knee    Back pain    intermittent pain, sees Dr. Limmie Patricia, Chiropractic   Family history of premature CAD    Hypertension    Impaired fasting blood sugar    Mixed dyslipidemia 2005   hereditary   Obesity    Wears glasses    Current Outpatient Medications on File Prior to Visit  Medication Sig Dispense Refill   aspirin (ASPIRIN LOW DOSE) 81 MG EC tablet Take 1 tablet (81 mg total) by mouth daily. Swallow whole. 90 tablet 3   cetirizine (ZYRTEC) 10 MG chewable tablet Chew 10 mg by mouth daily.     Dextromethorphan-guaiFENesin ([place] DM PO) Take by mouth.     fenofibrate (TRICOR) 145 MG tablet TAKE 1 TABLET(145 MG) BY MOUTH DAILY 30 tablet 3   icosapent Ethyl (VASCEPA) 1 g capsule TAKE 2 CAPSULES(2 GRAMS) BY MOUTH TWICE DAILY 120 capsule 2   Olmesartan-amLODIPine-HCTZ 40-10-25 MG TABS TAKE 1 TABLET BY MOUTH DAILY 90 tablet 0   rosuvastatin (CRESTOR) 40 MG tablet  TAKE 1 TABLET(40 MG) BY MOUTH DAILY 90 tablet 3   No current facility-administered medications on file prior to visit.     The following portions of the patient's history were reviewed and updated as appropriate: allergies, current medications, past family history, past medical history, past social history, past surgical history and problem list.  ROS Otherwise as in subjective above  Objective: BP 110/68   Pulse 84   Temp 98.6 F (37 C)   Wt 256 lb 6.4 oz (116.3 kg)   SpO2 98%   BMI 35.76 kg/m   General appearance: alert, no distress, well developed, well nourished HEENT: normocephalic, sclerae anicteric, conjunctiva pink and moist, TMs flat with serous fluid behind TMs, nares with erythema and mucus, pharynx normal Oral cavity: MMM, no lesions Neck: supple, no lymphadenopathy, no thyromegaly, no masses Heart: RRR, normal S1, S2, no murmurs Lungs: CTA bilaterally, no wheezes, rhonchi, or rales Pulses: 2+ radial pulses, 2+ pedal pulses, normal cap refill Ext: no edema    Assessment: Encounter Diagnoses  Name Primary?   Purulent nasal discharge Yes   History of COVID-19    Sinusitis, unspecified chronicity, unspecified location      Plan: Discussed symptoms, concerns.  He seems to have some lingering issues that  may be secondary bacterial.  Begin medication as below, continue to hydrate well and rest, can continue Mucinex DM the next few days, as nasal saline first.  If not much better within the next week then let me know  [place] was seen today for covid back in august.  Diagnoses and all orders for this visit:  Purulent nasal discharge  History of COVID-19  Sinusitis, unspecified chronicity, unspecified location  Other orders -     amoxicillin (AMOXIL) 875 MG tablet; Take 1 tablet (875 mg total) by mouth 2 (two) times daily for 10 days. -     predniSONE (DELTASONE) 10 MG tablet; 6 tablets all together day 1, 5 tablets day 2, 4 tablets day 3, 3 tablets day 4, 2  tablets day 5, 1 tablet day 6. -     Multiple Vitamins-Minerals (EMERGEN-C IMMUNE PLUS) PACK; Take 1 tablet by mouth 2 (two) times daily.    Follow up: prn

## 2022-10-15 ENCOUNTER — Other Ambulatory Visit: Payer: Self-pay | Admitting: Cardiology

## 2022-10-15 DIAGNOSIS — E782 Mixed hyperlipidemia: Secondary | ICD-10-CM

## 2022-10-15 DIAGNOSIS — E781 Pure hyperglyceridemia: Secondary | ICD-10-CM

## 2022-10-29 ENCOUNTER — Other Ambulatory Visit: Payer: Self-pay | Admitting: Medical

## 2022-11-07 ENCOUNTER — Telehealth: Payer: Self-pay | Admitting: Family Medicine

## 2022-11-07 NOTE — Telephone Encounter (Signed)
Pt coming in for cpe in Jan.  Pt going to [facility] for blood draw for cardiologist appt on 12/20.  Can you write script or put in order for pt draw for 12/20? Please advise pt

## 2022-11-08 ENCOUNTER — Other Ambulatory Visit: Payer: Self-pay | Admitting: Cardiology

## 2022-11-08 DIAGNOSIS — I2510 Atherosclerotic heart disease of native coronary artery without angina pectoris: Secondary | ICD-10-CM

## 2022-11-20 ENCOUNTER — Other Ambulatory Visit: Payer: 59

## 2022-11-20 ENCOUNTER — Other Ambulatory Visit: Payer: Self-pay | Admitting: Medical

## 2022-11-20 DIAGNOSIS — E785 Hyperlipidemia, unspecified: Secondary | ICD-10-CM

## 2022-11-20 DIAGNOSIS — I10 Essential (primary) hypertension: Secondary | ICD-10-CM

## 2022-11-20 DIAGNOSIS — Z125 Encounter for screening for malignant neoplasm of prostate: Secondary | ICD-10-CM

## 2022-11-20 DIAGNOSIS — R7301 Impaired fasting glucose: Secondary | ICD-10-CM

## 2022-11-20 DIAGNOSIS — Z0000 Encounter for general adult medical examination without abnormal findings: Secondary | ICD-10-CM

## 2022-11-21 ENCOUNTER — Ambulatory Visit: Payer: 59 | Admitting: Cardiology

## 2022-11-21 ENCOUNTER — Encounter: Payer: Self-pay | Admitting: Cardiology

## 2022-11-21 VITALS — BP 127/78 | HR 89 | Resp 16 | Ht 71.0 in | Wt 256.0 lb

## 2022-11-21 DIAGNOSIS — I2510 Atherosclerotic heart disease of native coronary artery without angina pectoris: Secondary | ICD-10-CM

## 2022-11-21 DIAGNOSIS — E781 Pure hyperglyceridemia: Secondary | ICD-10-CM

## 2022-11-21 DIAGNOSIS — I10 Essential (primary) hypertension: Secondary | ICD-10-CM

## 2022-11-21 DIAGNOSIS — E782 Mixed hyperlipidemia: Secondary | ICD-10-CM

## 2022-11-21 LAB — CBC WITH DIFFERENTIAL/PLATELET
Basophils Absolute: 0.1 10*3/uL (ref 0.0–0.2)
Basos: 1 %
EOS (ABSOLUTE): 0.2 10*3/uL (ref 0.0–0.4)
Eos: 3 %
Hematocrit: 43.1 % (ref 37.5–51.0)
Hemoglobin: 14.6 g/dL (ref 13.0–17.7)
Immature Grans (Abs): 0 10*3/uL (ref 0.0–0.1)
Immature Granulocytes: 0 %
Lymphocytes Absolute: 2.6 10*3/uL (ref 0.7–3.1)
Lymphs: 32 %
MCH: 28.7 pg (ref 26.6–33.0)
MCHC: 33.9 g/dL (ref 31.5–35.7)
MCV: 85 fL (ref 79–97)
Monocytes Absolute: 0.5 10*3/uL (ref 0.1–0.9)
Monocytes: 6 %
Neutrophils Absolute: 4.7 10*3/uL (ref 1.4–7.0)
Neutrophils: 58 %
Platelets: 299 10*3/uL (ref 150–450)
RBC: 5.09 x10E6/uL (ref 4.14–5.80)
RDW: 13.2 % (ref 11.6–15.4)
WBC: 8.1 10*3/uL (ref 3.4–10.8)

## 2022-11-21 LAB — COMPREHENSIVE METABOLIC PANEL
ALT: 36 IU/L (ref 0–44)
AST: 41 IU/L — ABNORMAL HIGH (ref 0–40)
Albumin/Globulin Ratio: 1.7 (ref 1.2–2.2)
Albumin: 4.7 g/dL (ref 3.9–4.9)
Alkaline Phosphatase: 76 IU/L (ref 44–121)
BUN/Creatinine Ratio: 21 (ref 10–24)
BUN: 23 mg/dL (ref 8–27)
Bilirubin Total: 0.3 mg/dL (ref 0.0–1.2)
CO2: 23 mmol/L (ref 20–29)
Calcium: 10.1 mg/dL (ref 8.6–10.2)
Chloride: 101 mmol/L (ref 96–106)
Creatinine, Ser: 1.07 mg/dL (ref 0.76–1.27)
Globulin, Total: 2.8 g/dL (ref 1.5–4.5)
Glucose: 158 mg/dL — ABNORMAL HIGH (ref 70–99)
Potassium: 5 mmol/L (ref 3.5–5.2)
Sodium: 137 mmol/L (ref 134–144)
Total Protein: 7.5 g/dL (ref 6.0–8.5)
eGFR: 78 mL/min/{1.73_m2} (ref >59)

## 2022-11-21 LAB — PSA: Prostate Specific Ag, Serum: 1.8 ng/mL (ref 0.0–4.0)

## 2022-11-21 LAB — LIPID PANEL
Chol/HDL Ratio: 4.7 ratio (ref 0.0–5.0)
Cholesterol, Total: 147 mg/dL (ref 100–199)
HDL: 31 mg/dL — ABNORMAL LOW (ref >39)
LDL Chol Calc (NIH): 75 mg/dL (ref 0–99)
Triglycerides: 251 mg/dL — ABNORMAL HIGH (ref 0–149)
VLDL Cholesterol Cal: 41 mg/dL — ABNORMAL HIGH (ref 5–40)

## 2022-11-21 LAB — HEMOGLOBIN A1C
Est. average glucose Bld gHb Est-mCnc: 166 mg/dL
Hgb A1c MFr Bld: 7.4 % — ABNORMAL HIGH (ref 4.8–5.6)

## 2022-11-21 MED ORDER — METFORMIN HCL 500 MG PO TABS: 500.0000 mg | ORAL_TABLET | Freq: Two times a day (BID) | ORAL | 2 refills | Status: DC

## 2022-11-21 NOTE — Progress Notes (Signed)
Patient referred by Carlena Hurl, PA-C for screening for CAD  Subjective:   Steven Fletcher, male    DOB: 09-28-1961, 61 y.o.   MRN: [id number]   Chief Complaint  Patient presents with   Coronary Artery Disease   Follow-up    64 month     HPI  61 y/o Caucasian male with hypertension, hyperlipidemia, prediabetes, obesity, family h/o premature CAD  Patient is doing well. She denies chest pain, shortness of breath, palpitations, leg edema, orthopnea, PND, TIA/syncope. Reviewed recent test results with the patient, details below.     Initial consultation HPI 08/2020: Patient works for [employer]. He denies chest pain, shortness of breath, palpitations, leg edema, orthopnea, PND, TIA/syncope. He has not been as active physically through the pandemic. His lost his brother at 23 due to fatal MI. He has been on statin fo a long time, but continues to have elevaed TG.     Current Outpatient Medications:    aspirin (ASPIRIN LOW DOSE) 81 MG EC tablet, Take 1 tablet (81 mg total) by mouth daily. Swallow whole., Disp: 90 tablet, Rfl: 3   cetirizine (ZYRTEC) 10 MG chewable tablet, Chew 10 mg by mouth daily., Disp: , Rfl:    Dextromethorphan-guaiFENesin (MUCINEX DM PO), Take by mouth., Disp: , Rfl:    fenofibrate (TRICOR) 145 MG tablet, TAKE 1 TABLET(145 MG) BY MOUTH DAILY, Disp: 30 tablet, Rfl: 3   icosapent Ethyl (VASCEPA) 1 g capsule, TAKE 2 CAPSULES(2 GRAMS) BY MOUTH TWICE DAILY, Disp: 120 capsule, Rfl: 2   Multiple Vitamins-Minerals (EMERGEN-C IMMUNE PLUS) PACK, Take 1 tablet by mouth 2 (two) times daily., Disp: 10 each, Rfl: 0   Olmesartan-amLODIPine-HCTZ 40-10-25 MG TABS, TAKE 1 TABLET BY MOUTH DAILY, Disp: 30 tablet, Rfl: 0   predniSONE (DELTASONE) 10 MG tablet, 6 tablets all together day 1, 5 tablets day 2, 4 tablets day 3, 3 tablets day 4, 2 tablets day 5, 1 tablet day 6., Disp: 21 tablet, Rfl: 0   rosuvastatin (CRESTOR) 40 MG tablet, TAKE 1 TABLET(40 MG) BY MOUTH DAILY,  Disp: 90 tablet, Rfl: 3    Cardiovascular and other pertinent studies:  EKG 11/21/2022: Sinus rhythm 85 bpm Normal EKG  Exercise Sestamibi Stress Test 08/21/2020: Normal ECG stress.  Patient reached 10.1 METS, and 97% of age predicted maximum heart rate. Exercise capacity was excellent. Chest pain not reported. Heart rate and hemodynamic response were normal. Stress EKG revealed no ischemic changes. Myocardial perfusion is normal. Overall LV systolic function is normal without regional wall motion abnormalities. Stress LV EF: 53%.  No previous exam available for comparison. Low risk.   CT cardiac scoring 08/03/2020: Total score: 626 LM: 0 LAD: 399 LCx: 3 RCA: 224  Small hiatal hernia. Mild right middle lobe atelectasis.  Recent labs: 11/20/2022: Glucose 158, BUN/Cr 23/1.07. EGFR 78. Na/K 137/5.0. AST 41. Rest of the CMP normal H/H 14/43. MCV 85. Platelets 299 HbA1C 7.4% Chol 147, TG 251, HDL 31, LDL 75  05/07/2022: Chol 153, TG 225, HDL 32, LDL 83  01/08/2022: Chol 144, TG 362, HDL 26, LDL 62  10/17/2021: Glucose 111, BUN/Cr 20/1.20. EGFR 68. Na/K 137/4.8. Rest of the CMP normal H/H 13/41. MCV 83. Platelets 292 HbA1C 6.9%  08/29/2021: Chol 152, TG 382, HDL 30, LDL 63  05/29/2021: Chol 176, TG 407, HDL 34, LDL 77  02/22/2021: Chol 186, TG 215, HDL 39, LDL 110  06/28/2020: Glucose 106, BUN/Cr 17/0.90. EGFR 92. Na/K 135/4.4. AST 42. Rest of the  CMP normal H/H 14/44. MCV 88. Platelets 248 HbA1C 6.3% Chol 179, TG 384, HDL 31, LDL 85    Review of Systems  Cardiovascular:  Negative for chest pain, dyspnea on exertion, leg swelling, palpitations and syncope.         Vitals:   11/21/22 1428  BP: 127/78  Pulse: 89  Resp: 16  SpO2: 97%     Body mass index is 35.7 kg/m. Filed Weights   11/21/22 1428  Weight: 256 lb (116.1 kg)     Objective:   Physical Exam Vitals and nursing note reviewed.  Constitutional:      General: He is not in acute  distress. Neck:     Vascular: No JVD.  Cardiovascular:     Rate and Rhythm: Normal rate and regular rhythm.     Heart sounds: Normal heart sounds. No murmur heard. Pulmonary:     Effort: Pulmonary effort is normal.     Breath sounds: Normal breath sounds. No wheezing or rales.  Musculoskeletal:     Right lower leg: No edema.     Left lower leg: No edema.           Assessment & Recommendations:   61 y/o Caucasian male with hypertension, hyperlipidemia, prediabetes, obesity, family h/o premature CAD, elevated calcium score (08/2020), no ischemic on stress test (09/2020)  CAD: Calcium score (08/2020) 626, including LM involvement. No ischemia on stress test (09/2020) Continue aggressive risk factor modification, including Aspirin, statin  Mixed hyperlipidemia: Chol 147, TG 251, HDL 31, LDL 75 with A1C 7.4% (11/2022) Currently on Crestor 40 mg, Vascepa 2  bid, Tricor 145 mg.  Continue the same. Recommend low carb low fat diet and aggressive management for type 2 DM. I have started him on metformin 500 mg bid until he sees his PCP at the next visit, where this can be up titrated as necessary.   Hypertension: Well controlled  F/u in 6 months   Nigel Mormon, MD Pager: [phone] Office: [phone]

## 2022-11-21 NOTE — Progress Notes (Signed)
Results sent through MyChart

## 2022-11-23 ENCOUNTER — Encounter: Payer: Self-pay | Admitting: Cardiology

## 2022-11-26 ENCOUNTER — Other Ambulatory Visit: Payer: Self-pay | Admitting: Medical

## 2022-12-17 ENCOUNTER — Other Ambulatory Visit: Payer: Self-pay | Admitting: Cardiology

## 2022-12-17 DIAGNOSIS — I2510 Atherosclerotic heart disease of native coronary artery without angina pectoris: Secondary | ICD-10-CM

## 2023-01-01 ENCOUNTER — Encounter: Payer: Self-pay | Admitting: Medical

## 2023-01-01 ENCOUNTER — Ambulatory Visit (INDEPENDENT_AMBULATORY_CARE_PROVIDER_SITE_OTHER): Payer: No Typology Code available for payment source | Admitting: Medical

## 2023-01-01 VITALS — BP 104/60 | HR 97 | Ht 72.0 in | Wt 246.0 lb

## 2023-01-01 DIAGNOSIS — Z23 Encounter for immunization: Secondary | ICD-10-CM | POA: Diagnosis not present

## 2023-01-01 DIAGNOSIS — Z1211 Encounter for screening for malignant neoplasm of colon: Secondary | ICD-10-CM

## 2023-01-01 DIAGNOSIS — Z0000 Encounter for general adult medical examination without abnormal findings: Secondary | ICD-10-CM

## 2023-01-01 DIAGNOSIS — R7301 Impaired fasting glucose: Secondary | ICD-10-CM | POA: Diagnosis not present

## 2023-01-01 DIAGNOSIS — Z7185 Encounter for immunization safety counseling: Secondary | ICD-10-CM

## 2023-01-01 DIAGNOSIS — R931 Abnormal findings on diagnostic imaging of heart and coronary circulation: Secondary | ICD-10-CM

## 2023-01-01 DIAGNOSIS — E785 Hyperlipidemia, unspecified: Secondary | ICD-10-CM

## 2023-01-01 DIAGNOSIS — R031 Nonspecific low blood-pressure reading: Secondary | ICD-10-CM

## 2023-01-01 DIAGNOSIS — I2510 Atherosclerotic heart disease of native coronary artery without angina pectoris: Secondary | ICD-10-CM

## 2023-01-01 DIAGNOSIS — I10 Essential (primary) hypertension: Secondary | ICD-10-CM

## 2023-01-01 DIAGNOSIS — E782 Mixed hyperlipidemia: Secondary | ICD-10-CM

## 2023-01-01 DIAGNOSIS — E118 Type 2 diabetes mellitus with unspecified complications: Secondary | ICD-10-CM | POA: Diagnosis not present

## 2023-01-01 MED ORDER — AMLODIPINE-OLMESARTAN 5-20 MG PO TABS: 1.0000 | ORAL_TABLET | Freq: Every day | ORAL | 0 refills | Status: DC

## 2023-01-01 MED ORDER — TIRZEPATIDE 5 MG/0.5ML ~~LOC~~ SOAJ: 5.0000 mg | SUBCUTANEOUS | 0 refills | Status: DC

## 2023-01-01 MED ORDER — TIRZEPATIDE 2.5 MG/0.5ML ~~LOC~~ SOAJ: 2.5000 mg | SUBCUTANEOUS | 0 refills | Status: DC

## 2023-01-01 NOTE — Progress Notes (Signed)
Subjective:   HPI  Steven Fletcher is a 62 y.o. male who presents for a complete physical.   Chief Complaint  Patient presents with   nonfasting cpe    Nonfasting cpe, losing weight     Concerns: Working on losing weight.  Trying to eat healthy of late.  Exercising with walking.    Recently after losing about 10lb, BP has been running low even in the sub 100 SBP.  He stopped his BP medication this past weekend due to low BP and feeling lightheaded.  No other c/o  Reviewed their medical, surgical, family, social, medication, and allergy history and updated chart as appropriate.  Past Medical History:  Diagnosis Date   Agatston coronary artery calcium score greater than 400 08/21/2020   Allergy    seasonal    Arthritis of left knee    Back pain    intermittent pain, sees Dr. Limmie Patricia, Chiropractic   Family history of premature CAD    Hypertension    Impaired fasting blood sugar    Mixed dyslipidemia 2005   hereditary   Obesity    Wears glasses     Past Surgical History:  Procedure Laterality Date   COLONOSCOPY  2010   [place]; normal, repeat 2020   COLONOSCOPY  06/2019   benign polyps, diverticulosis, Dr. 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  1 child.   No significant other.  12/2022   Social Determinants of Health   Financial Resource Strain: Not on file  Food Insecurity: Not on file  Transportation Needs: Not on file  Physical Activity: Not on file  Stress: Not on file  Social Connections: Not on file  Intimate Partner Violence: Not on file    Family History  Problem Relation Age of Onset   Alzheimer's disease Mother    Hyperlipidemia Mother    Hypertension Father    Other Father        died of old age   54 Sister        breast   Hyperlipidemia Sister    Heart disease Brother 6       died of MI   Hyperlipidemia Brother    Diabetes Maternal Grandmother    Diabetes Paternal Grandmother    Stroke Neg Hx    Colon cancer Neg Hx    Colon polyps Neg  Hx    Esophageal cancer Neg Hx    Ulcerative colitis Neg Hx    Stomach cancer Neg Hx    Rectal cancer Neg Hx      Current Outpatient Medications:    amLODipine-olmesartan (AZOR) 5-20 MG tablet, Take 1 tablet by mouth daily., Disp: 30 tablet, Rfl: 0   aspirin (ASPIRIN LOW DOSE) 81 MG EC tablet, Take 1 tablet (81 mg total) by mouth daily. Swallow whole., Disp: 90 tablet, Rfl: 3   cetirizine (ZYRTEC) 10 MG chewable tablet, Chew 10 mg by mouth daily., Disp: , Rfl:    Dextromethorphan-guaiFENesin (MUCINEX DM PO), Take by mouth., Disp: , Rfl:    fenofibrate (TRICOR) 145 MG tablet, TAKE 1 TABLET(145 MG) BY MOUTH DAILY, Disp: 30 tablet, Rfl: 3   icosapent Ethyl (VASCEPA) 1 g capsule, TAKE 2 CAPSULES(2 GRAMS) BY MOUTH TWICE DAILY, Disp: 90 capsule, Rfl: 1   rosuvastatin (CRESTOR) 40 MG tablet, TAKE 1 TABLET(40 MG) BY MOUTH DAILY, Disp: 90 tablet, Rfl: 3   tirzepatide (MOUNJARO) 2.5 MG/0.5ML Pen, Inject 2.5 mg into the skin once a week., Disp: 2 mL, Rfl: 0   tirzepatide (MOUNJARO) 5 MG/0.5ML Pen, Inject  5 mg into the skin once a week., Disp: 6 mL, Rfl: 0  No Known Allergies  Review of Systems  Constitutional:  Negative for chills, fever, malaise/fatigue and weight loss.  HENT:  Negative for congestion, ear pain, hearing loss, sore throat and tinnitus.   Eyes:  Negative for blurred vision, pain and redness.  Respiratory:  Negative for cough, hemoptysis and shortness of breath.   Cardiovascular:  Negative for chest pain, palpitations, orthopnea, claudication and leg swelling.  Gastrointestinal:  Negative for abdominal pain, blood in stool, constipation, diarrhea, nausea and vomiting.  Genitourinary:  Negative for dysuria, flank pain, frequency, hematuria and urgency.  Musculoskeletal:  Negative for falls, joint pain and myalgias.  Skin:  Negative for itching and rash.  Neurological:  Negative for dizziness, tingling, speech change, weakness and headaches.  Endo/Heme/Allergies:  Negative for polydipsia. Does not bruise/bleed easily.  Psychiatric/Behavioral:  Negative for depression and memory loss. The patient is not nervous/anxious and does not have insomnia.         Objective:   Physical Exam  BP 104/60   Pulse 97   Ht 6' (1.829 m)   Wt 246 lb (111.6 kg)   BMI 33.36 kg/m   BP Readings from Last 3 Encounters:  01/01/23 104/60  11/21/22 127/78  09/09/22 110/68   Wt Readings from Last 3 Encounters:  01/01/23 246 lb (111.6 kg)  11/21/22 256 lb (116.1 kg)  09/09/22 256 lb 6.4 oz (116.3 kg)   General appearance: alert, no distress, WD/WN, white male Skin: scattered macules, no worrisome lesions Neck: supple, no lymphadenopathy, no thyromegaly, no masses, normal ROM, no bruits Chest: non tender, normal shape and expansion Heart: RRR, normal S1, S2, no murmurs Lungs: CTA bilaterally, no wheezes, rhonchi, or rales Abdomen: +bs, soft, bulge in upper abdomen c/w diastasis recti, non tender, non distended, no masses, no hepatomegaly, no splenomegaly, no bruits Back: non tender,  normal ROM, no scoliosis Musculoskeletal: extremities non tender, no obvious deformity, normal ROM throughout Extremities: no edema, no cyanosis, no clubbing Pulses: 2+ symmetric, upper and lower extremities, normal cap refill Neurological: alert, oriented x 3, CN2-12 intact, strength normal upper extremities and lower extremities, sensation normal throughout, DTRs 2+ throughout, no cerebellar signs, gait normal Psychiatric: normal affect, behavior normal, pleasant  GU/rectal -deferred    Assessment and Plan :  Encounter Diagnoses  Name Primary?   Encounter for health maintenance examination in adult Yes   Type II diabetes mellitus with complication ([place])    Essential hypertension    Impaired fasting blood sugar    Dyslipidemia    Vaccine counseling    Mixed hyperlipidemia    Elevated coronary artery calcium score    Coronary artery disease involving native coronary artery of native heart without angina pectoris    Low blood pressure reading    Colon cancer screening    COVID-19 vaccine administered    Needs flu shot     Physical exam - discussed and counseled on healthy lifestyle, diet, exercise, preventative care, vaccinations, sick and well care, proper use of emergency dept and after hours care, and addressed their concerns.    Health screening: See your eye doctor yearly for routine vision care. See your dentist yearly for routine dental care including hygiene visits twice yearly.   Cancer screening Colonoscopy:  Reviewed colonoscopy on file that is up to date from 2020, due repeat in 10 years. Will send home today with fecal test for blood.  Discussed PSA, prostate exam, and prostate cancer screening risks/benefits.      Vaccinations: Immunization History  Administered Date(s) Administered   COVID-19, mRNA, vaccine(Comirnaty)12 years and older 01/01/2023   Influenza,inj,Quad PF,6+ Mos 01/01/2023   Influenza-Unspecified 10/02/2016, 10/02/2018, 09/26/2021    PFIZER(Purple Top)SARS-COV-2 Vaccination 02/04/2020, 03/09/2020   PNEUMOCOCCAL CONJUGATE-20 10/17/2021   Pfizer Covid-19 Vaccine Bivalent Booster 37yr & up 09/26/2021   Tdap 08/02/2014   Zoster Recombinat (Shingrix) 06/28/2020, 08/29/2020   Counseled on the influenza virus vaccine.  Vaccine information sheet given.  Influenza vaccine given after consent obtained.  Counseled on the Covid virus vaccine.  Vaccine information sheet given.  Covid vaccine given after consent obtained.    Acute issues discussed: none   Separate significant chronic issues discussed Hypertension with recent low blood pressure readings after losing 10 pounds intentionally.  Change from Tribenzor to amlodipine telmisartan as below.  Recheck in 6 weeks.  Monitor blood pressures at home and let [place] if any concerns  Hyperlipidemia, CAD-currently on prescription for shoulder fenofibrate and statin.  Recent labs are improved but not at goal still.  Continue efforts with diet and exercise and weight loss, continue current medications  Obesity-begin trial of Mounjaro along with healthy diet and exercise changes  Diabetes type 2 new diagnosis-we discussed diagnosis, criteria, start metformin for now and begin trial of Mounjaro to help with sugar control, weight loss and for heart disease benefit.  Follow-up in 6 weeks   PCurriewas seen today for nonfasting cpe.  Diagnoses and all orders for this visit:  Encounter for health maintenance examination in adult  [place]  Essential hypertension  Impaired fasting blood sugar  Dyslipidemia  Vaccine counseling  Mixed hyperlipidemia  Elevated coronary artery calcium score  Coronary artery disease involving native coronary artery of native heart without angina pectoris  Low blood pressure reading  Colon cancer screening -     Fecal occult blood, imunochemical  COVID-19 vaccine administered -     Pfizer Fall 2023  Covid-19 Vaccine 128yrand older  Needs flu shot -     Flu Vaccine QUAD 30m75mo (Fluarix, Fluzone & Alfiuria Quad PF)  Other orders -     tirzepatide (MOUNJARO) 2.5 MG/0.5ML Pen; Inject 2.5 mg into the skin once a week. -     tirzepatide (MOUNJARO) 5 MG/0.5ML Pen; Inject 5  mg into the skin once a week. -     amLODipine-olmesartan (AZOR) 5-20 MG tablet; Take 1 tablet by mouth daily.    F/u pending labs

## 2023-01-07 LAB — FECAL OCCULT BLOOD, IMMUNOCHEMICAL: Fecal Occult Bld: NEGATIVE

## 2023-01-08 NOTE — Progress Notes (Signed)
Results sent through MyChart

## 2023-01-11 ENCOUNTER — Telehealth: Payer: Self-pay | Admitting: Medical

## 2023-01-11 NOTE — Telephone Encounter (Signed)
P.A. MOUNJARO 

## 2023-01-13 NOTE — Telephone Encounter (Signed)
Medical records faxed to plan for P.A.

## 2023-01-23 NOTE — Telephone Encounter (Signed)
P.A. approved til 01/14/24, sent mychart message

## 2023-01-31 ENCOUNTER — Other Ambulatory Visit: Payer: Self-pay | Admitting: Cardiology

## 2023-01-31 DIAGNOSIS — I2510 Atherosclerotic heart disease of native coronary artery without angina pectoris: Secondary | ICD-10-CM

## 2023-02-02 NOTE — Telephone Encounter (Signed)
P.A. done & approved

## 2023-02-06 ENCOUNTER — Encounter: Payer: Self-pay | Admitting: Radiology

## 2023-02-06 ENCOUNTER — Other Ambulatory Visit: Payer: Self-pay | Admitting: Medical

## 2023-02-06 NOTE — Telephone Encounter (Signed)
?   Change in medication  

## 2023-02-12 ENCOUNTER — Encounter: Payer: Self-pay | Admitting: Medical

## 2023-02-12 ENCOUNTER — Ambulatory Visit: Payer: No Typology Code available for payment source | Admitting: Medical

## 2023-02-12 VITALS — BP 138/88 | HR 91 | Resp 16 | Wt 228.0 lb

## 2023-02-12 DIAGNOSIS — I10 Essential (primary) hypertension: Secondary | ICD-10-CM | POA: Diagnosis not present

## 2023-02-12 DIAGNOSIS — E781 Pure hyperglyceridemia: Secondary | ICD-10-CM

## 2023-02-12 DIAGNOSIS — I2510 Atherosclerotic heart disease of native coronary artery without angina pectoris: Secondary | ICD-10-CM | POA: Diagnosis not present

## 2023-02-12 DIAGNOSIS — E118 Type 2 diabetes mellitus with unspecified complications: Secondary | ICD-10-CM | POA: Diagnosis not present

## 2023-02-12 DIAGNOSIS — E782 Mixed hyperlipidemia: Secondary | ICD-10-CM | POA: Diagnosis not present

## 2023-02-12 NOTE — Progress Notes (Signed)
Subjective:  Steven Fletcher is a 62 y.o. male who presents for Chief Complaint  Patient presents with   Diabetes    6 week diabetes follow up.     Here for med check follow-up.  At his recent physical visit and labs in December 2023 his hemoglobin A1c was 7.4%, blood sugar over 126.  At that visit I advised a change in blood pressure medicine as his blood pressure was not at goal, we had recommended a trial of Mounjaro and metformin.  However in recent months he decided he was going to work on lifestyle changes.  He never started Steven Fletcher.  He emailed me at the end of February stating that he had lost weight, that his 14-day average blood sugar was 92, he never started the new blood pressure pill either and home readings were running normal.  HTN - currently not on BP medication at all  Diabetes - currently on Metformin '500mg'$  BID, but never started mounjaro from last visit     Is compliant with vascepa and fenofibrate and aspirin.  Exercising with walking.  Mainly controlling sugars and weight with diet.  No other aggravating or relieving factors.    No other c/o.  Past Medical History:  Diagnosis Date   Agatston coronary artery calcium score greater than 400 08/21/2020   Allergy    seasonal    Arthritis of left knee    Back pain    intermittent pain, sees Dr. Limmie Fletcher, Chiropractic   Family history of premature CAD    Hypertension    Impaired fasting blood sugar    Mixed dyslipidemia 2005   hereditary   Obesity    Wears glasses    Current Outpatient Medications on File Prior to Visit  Medication Sig Dispense Refill   aspirin (ASPIRIN LOW DOSE) 81 MG EC tablet Take 1 tablet (81 mg total) by mouth daily. Swallow whole. 90 tablet 3   cetirizine (ZYRTEC) 10 MG chewable tablet Chew 10 mg by mouth daily.     Dextromethorphan-guaiFENesin ([place] DM PO) Take by mouth.     fenofibrate (TRICOR) 145 MG tablet TAKE 1 TABLET(145 MG) BY MOUTH DAILY 30 tablet 3   icosapent Ethyl  (VASCEPA) 1 g capsule TAKE 2 CAPSULES(2 GRAMS) BY MOUTH TWICE DAILY 90 capsule 1   metFORMIN (GLUCOPHAGE) 500 MG tablet Take 500 mg by mouth 2 (two) times daily.     rosuvastatin (CRESTOR) 40 MG tablet TAKE 1 TABLET(40 MG) BY MOUTH DAILY 90 tablet 3   amLODipine-olmesartan (AZOR) 5-20 MG tablet Take 1 tablet by mouth daily. (Patient not taking: Reported on 02/12/2023) 30 tablet 0   tirzepatide (MOUNJARO) 2.5 MG/0.5ML Pen Inject 2.5 mg into the skin once a week. (Patient not taking: Reported on 02/12/2023) 2 mL 0   tirzepatide (MOUNJARO) 5 MG/0.5ML Pen Inject 5 mg into the skin once a week. (Patient not taking: Reported on 02/12/2023) 6 mL 0   No current facility-administered medications on file prior to visit.   The following portions of the patient's history were reviewed and updated as appropriate: allergies, current medications, past family history, past medical history, past social history, past surgical history and problem list.  ROS Otherwise as in subjective above    Objective: BP 138/88   Pulse 91   Resp 16   Wt 228 lb (103.4 kg)   SpO2 96% Comment: room air  BMI 30.92 kg/m   Wt Readings from Last 3 Encounters:  02/12/23 228 lb (103.4 kg)  01/01/23 246  lb (111.6 kg)  11/21/22 256 lb (116.1 kg)   BP Readings from Last 3 Encounters:  02/12/23 138/88  01/01/23 104/60  11/21/22 127/78   General appearance: alert, no distress, well developed, well nourished     Assessment: Encounter Diagnoses  Name Primary?   Type II diabetes mellitus with complication ([facility]) Yes   Coronary artery disease involving native coronary artery of native heart without angina pectoris    Essential hypertension    Mixed hyperlipidemia    Hypertriglyceridemia      Plan: Congratulations on weight loss and healthy lifestyle changes  Patient Instructions  Hypertension Continue to monitor home BPs.  Goal is <130/80.  As of today and recent months, you have not been taking BP medication If  you start to see numbers average >130/80, we will need to add back something to help control BPs. I will wait to hear back from you on BP monitoring. If you do start seeing numbers too high, you can add back 1/2 tablet of the combo pill you have at home, Amlodipine Olmesartan 5/'20mg'$  daily.   Diabetes  Continue metformin for now Continue monitoring blood sugars   Ask about HgbA1C and lipid labs when you see cardiology in June   Continue efforts at additional weight loss  Steven Fletcher was seen today for diabetes.  Diagnoses and all orders for this visit:  Type II diabetes mellitus with complication ([place])  Coronary artery disease involving native coronary artery of native heart without angina pectoris  Essential hypertension  Mixed hyperlipidemia  Hypertriglyceridemia    Follow up: 30mo

## 2023-02-12 NOTE — Patient Instructions (Addendum)
Hypertension Continue to monitor home BPs.  Goal is <130/80.  As of today and recent months, you have not been taking BP medication If you start to see numbers average >130/80, we will need to add back something to help control BPs. I will wait to hear back from you on BP monitoring. If you do start seeing numbers too high, you can add back 1/2 tablet of the combo pill you have at home, Amlodipine Olmesartan 5/'20mg'$  daily.   Diabetes  Continue metformin for now Continue monitoring blood sugars   Ask about HgbA1C and lipid labs when you see cardiology in June   Continue efforts at additional weight loss

## 2023-03-01 ENCOUNTER — Other Ambulatory Visit: Payer: Self-pay | Admitting: Cardiology

## 2023-03-04 ENCOUNTER — Other Ambulatory Visit: Payer: Self-pay | Admitting: Cardiology

## 2023-03-04 DIAGNOSIS — E781 Pure hyperglyceridemia: Secondary | ICD-10-CM

## 2023-03-04 DIAGNOSIS — E782 Mixed hyperlipidemia: Secondary | ICD-10-CM

## 2023-03-04 MED ORDER — FENOFIBRATE 145 MG PO TABS: ORAL_TABLET | ORAL | 0 refills | Status: DC

## 2023-03-04 MED ORDER — METFORMIN HCL 500 MG PO TABS: 500.0000 mg | ORAL_TABLET | Freq: Two times a day (BID) | ORAL | 0 refills | Status: DC

## 2023-03-10 ENCOUNTER — Ambulatory Visit (INDEPENDENT_AMBULATORY_CARE_PROVIDER_SITE_OTHER): Payer: No Typology Code available for payment source | Admitting: Medical

## 2023-03-10 VITALS — BP 110/70 | HR 67 | Temp 97.1°F | Wt 224.6 lb

## 2023-03-10 DIAGNOSIS — L259 Unspecified contact dermatitis, unspecified cause: Secondary | ICD-10-CM

## 2023-03-10 MED ORDER — TRIAMCINOLONE ACETONIDE 0.025 % EX CREA: 1.0000 | TOPICAL_CREAM | Freq: Two times a day (BID) | CUTANEOUS | 1 refills | Status: DC

## 2023-03-10 MED ORDER — PREDNISONE 20 MG PO TABS: ORAL_TABLET | ORAL | 0 refills | Status: DC

## 2023-03-10 NOTE — Progress Notes (Signed)
Subjective:  Steven Fletcher is a 61 y.o. male who presents for Chief Complaint  Patient presents with   spot on face    Spot on face- moving towards right on eye, spot on chest and foot. Noticed it on Saturday. Some itching but no blisters     Here for rash.  Rash started about 3 to 4 days ago.  First appeared on his right face and then a patch under his right arm and a patch on the medial left foot.  He denies any particular exposure.  He had a tree cut down in his yard, but he denies doing any of the work.  No other exposure.  Hasn't used any treatment.  Worried that the rash is close to his eye. No other aggravating or relieving factors.    No other c/o.  The following portions of the patient's history were reviewed and updated as appropriate: allergies, current medications, past family history, past medical history, past social history, past surgical history and problem list.  ROS Otherwise as in subjective above    Objective: BP 110/70   Pulse 67   Temp (!) 97.1 F (36.2 C)   Wt 224 lb 9.6 oz (101.9 kg)   BMI 30.46 kg/m   General appearance: alert, no distress, well developed, well nourished Skin: Right face with a linear area of urticaria approximately 12 cm x 8 cm and puffiness of right cheek but not affecting eye.   There is a small similar linear 2 cm patch of urticaria on the right axilla and a small similar lesion on the medial left ankle    Assessment: Encounter Diagnosis  Name Primary?   Contact dermatitis, unspecified contact dermatitis type, unspecified trigger Yes     Plan: We discussed the skin findings, likely contact allergy.  Begin mild steroid cream for few days, oral prednisone for a few days below.  If not resolved by the end of the week, let me know   [name] was seen today for spot on face.  Diagnoses and all orders for this visit:  Contact dermatitis, unspecified contact dermatitis type, unspecified trigger  Other orders -     triamcinolone  (KENALOG) 0.025 % cream; Apply 1 Application topically 2 (two) times daily. -     predniSONE (DELTASONE) 20 MG tablet; 3 tablets today, 2 tablets tomorrow, 1 tablet the third day    Follow up: prn

## 2023-03-21 ENCOUNTER — Other Ambulatory Visit: Payer: Self-pay | Admitting: Cardiology

## 2023-03-21 DIAGNOSIS — I2510 Atherosclerotic heart disease of native coronary artery without angina pectoris: Secondary | ICD-10-CM

## 2023-05-10 ENCOUNTER — Encounter: Payer: Self-pay | Admitting: Cardiology

## 2023-05-10 ENCOUNTER — Other Ambulatory Visit: Payer: Self-pay | Admitting: Cardiology

## 2023-05-10 DIAGNOSIS — I2510 Atherosclerotic heart disease of native coronary artery without angina pectoris: Secondary | ICD-10-CM

## 2023-05-12 ENCOUNTER — Other Ambulatory Visit: Payer: Self-pay | Admitting: Cardiology

## 2023-05-12 DIAGNOSIS — E782 Mixed hyperlipidemia: Secondary | ICD-10-CM

## 2023-05-16 LAB — LIPID PANEL
Chol/HDL Ratio: 3.1 ratio (ref 0.0–5.0)
Cholesterol, Total: 128 mg/dL (ref 100–199)
HDL: 41 mg/dL (ref >39)
LDL Chol Calc (NIH): 69 mg/dL (ref 0–99)
Triglycerides: 96 mg/dL (ref 0–149)
VLDL Cholesterol Cal: 18 mg/dL (ref 5–40)

## 2023-05-19 ENCOUNTER — Encounter: Payer: Self-pay | Admitting: Cardiology

## 2023-05-19 ENCOUNTER — Ambulatory Visit: Payer: No Typology Code available for payment source | Admitting: Cardiology

## 2023-05-19 VITALS — BP 136/86 | HR 79 | Ht 72.0 in | Wt 222.2 lb

## 2023-05-19 DIAGNOSIS — I2510 Atherosclerotic heart disease of native coronary artery without angina pectoris: Secondary | ICD-10-CM

## 2023-05-19 DIAGNOSIS — E118 Type 2 diabetes mellitus with unspecified complications: Secondary | ICD-10-CM

## 2023-05-19 DIAGNOSIS — E782 Mixed hyperlipidemia: Secondary | ICD-10-CM

## 2023-05-19 NOTE — Progress Notes (Signed)
Patient referred by Jac Canavan, PA-C for screening for CAD  Subjective:   Steven Fletcher, male    DOB: 1961-11-25, 62 y.o.   MRN: [id number]   Chief Complaint  Patient presents with   Coronary Artery Disease   Follow-up   Results     HPI  62 y/o Caucasian male with hypertension, hyperlipidemia, prediabetes, obesity, family h/o premature CAD  Patient is doing well.  He has made remarkable changes to his diet and lifestyle and has lost a lot of weight.  Recent lipid panel is much improved, details below.    Initial consultation HPI 08/2020: Patient works for [employer]. He denies chest pain, shortness of breath, palpitations, leg edema, orthopnea, PND, TIA/syncope. He has not been as active physically through the pandemic. His lost his brother at 75 due to fatal MI. He has been on statin fo a long time, but continues to have elevaed TG.     Current Outpatient Medications:    aspirin (ASPIRIN LOW DOSE) 81 MG EC tablet, Take 1 tablet (81 mg total) by mouth daily. Swallow whole., Disp: 90 tablet, Rfl: 3   cetirizine (ZYRTEC) 10 MG chewable tablet, Chew 10 mg by mouth daily., Disp: , Rfl:    fenofibrate (TRICOR) 145 MG tablet, TAKE 1 TABLET(145 MG) BY MOUTH DAILY, Disp: 90 tablet, Rfl: 0   icosapent Ethyl (VASCEPA) 1 g capsule, TAKE 2 CAPSULES(2 GRAMS) BY MOUTH TWICE DAILY, Disp: 90 capsule, Rfl: 1   metFORMIN (GLUCOPHAGE) 500 MG tablet, Take 1 tablet (500 mg total) by mouth 2 (two) times daily., Disp: 180 tablet, Rfl: 0   rosuvastatin (CRESTOR) 40 MG tablet, TAKE 1 TABLET(40 MG) BY MOUTH DAILY, Disp: 90 tablet, Rfl: 3   triamcinolone (KENALOG) 0.025 % cream, Apply 1 Application topically 2 (two) times daily., Disp: 15 g, Rfl: 1    Cardiovascular and other pertinent studies:  EKG 05/19/2023: Sinus rhythm 77 bpm  Low voltage in limb leads Otherwise normal EKG  Exercise Sestamibi Stress Test 08/21/2020: Normal ECG stress.  Patient reached 10.1 METS, and 97% of age  predicted maximum heart rate. Exercise capacity was excellent. Chest pain not reported. Heart rate and hemodynamic response were normal. Stress EKG revealed no ischemic changes. Myocardial perfusion is normal. Overall LV systolic function is normal without regional wall motion abnormalities. Stress LV EF: 53%.  No previous exam available for comparison. Low risk.   CT cardiac scoring 08/03/2020: Total score: 626 LM: 0 LAD: 399 LCx: 3 RCA: 224  Small hiatal hernia. Mild right middle lobe atelectasis.  Recent labs: 05/15/2023: Chol 128, TG 96, HDL 41, LDL 69  11/20/2022: Glucose 158, BUN/Cr 23/1.07. EGFR 78. Na/K 137/5.0. AST 41. Rest of the CMP normal H/H 14/43. MCV 85. Platelets 299 HbA1C 7.4% Chol 147, TG 251, HDL 31, LDL 75  02/22/2021: Chol 186, TG 215, HDL 39, LDL 110     Review of Systems  Cardiovascular:  Negative for chest pain, dyspnea on exertion, leg swelling, palpitations and syncope.         Vitals:   05/19/23 1244  BP: 136/86  Pulse: 79  SpO2: 98%     Body mass index is 30.14 kg/m. Filed Weights   05/19/23 1244  Weight: 222 lb 3.2 oz (100.8 kg)     Objective:   Physical Exam Vitals and nursing note reviewed.  Constitutional:      General: He is not in acute distress. Neck:     Vascular: No JVD.  Cardiovascular:     Rate and Rhythm: Normal rate and regular rhythm.     Heart sounds: Normal heart sounds. No murmur heard. Pulmonary:     Effort: Pulmonary effort is normal.     Breath sounds: Normal breath sounds. No wheezing or rales.  Musculoskeletal:     Right lower leg: No edema.     Left lower leg: No edema.            Assessment & Recommendations:   62 y/o Caucasian male with hypertension, hyperlipidemia, prediabetes, obesity, family h/o premature CAD, elevated calcium score (08/2020), no ischemic on stress test (09/2020)  CAD: Calcium score (08/2020) 626, including LM involvement. No ischemia on stress test (09/2020) Continue  aggressive risk factor modification, including Aspirin, statin  Mixed hyperlipidemia: Remarkable improvement in lipid panel after recent diet and lifestyle modifications. He is also on Crestor 40 mg, Vascepa 2  bid, Tricor 145 mg.  Offered to wean off Vascepa, but he would like to continue the same for now.  He has upcoming follow-up with PCP. If he continues his excellent diet and lifestyle modifications, he could certainly be able to, on Vascepa and/or Tricor in the future.  Type II DM: Started on metformin by me given A1c of 7.4%. He will continue to follow-up with PCP regarding the same.  Hypertension: Well controlled  F/u in 6 months   Elder Negus, MD Pager: [phone] Office: [phone]

## 2023-05-23 ENCOUNTER — Ambulatory Visit: Payer: No Typology Code available for payment source | Admitting: Cardiology

## 2023-05-30 ENCOUNTER — Other Ambulatory Visit: Payer: Self-pay | Admitting: Medical

## 2023-05-30 DIAGNOSIS — E781 Pure hyperglyceridemia: Secondary | ICD-10-CM

## 2023-05-30 DIAGNOSIS — E782 Mixed hyperlipidemia: Secondary | ICD-10-CM

## 2023-05-30 MED ORDER — ROSUVASTATIN CALCIUM 40 MG PO TABS
ORAL_TABLET | ORAL | 1 refills | Status: DC
Start: 2023-05-30 — End: 2023-12-26

## 2023-05-30 MED ORDER — FENOFIBRATE 145 MG PO TABS: ORAL_TABLET | ORAL | 1 refills | Status: DC

## 2023-05-30 MED ORDER — METFORMIN HCL 500 MG PO TABS: 500.0000 mg | ORAL_TABLET | Freq: Two times a day (BID) | ORAL | 1 refills | Status: DC

## 2023-06-02 ENCOUNTER — Encounter: Payer: Self-pay | Admitting: Physician Assistant

## 2023-06-02 ENCOUNTER — Ambulatory Visit: Payer: 59 | Admitting: Physician Assistant

## 2023-06-02 DIAGNOSIS — M1712 Unilateral primary osteoarthritis, left knee: Secondary | ICD-10-CM

## 2023-06-02 MED ORDER — METHYLPREDNISOLONE ACETATE 40 MG/ML IJ SUSP
40.0000 mg | INTRAMUSCULAR | Status: AC | PRN
Start: 2023-06-02 — End: 2023-06-02
  Administered 2023-06-02: 40 mg via INTRA_ARTICULAR

## 2023-06-02 MED ORDER — LIDOCAINE HCL 1 % IJ SOLN
3.0000 mL | INTRAMUSCULAR | Status: AC | PRN
Start: 2023-06-02 — End: 2023-06-02
  Administered 2023-06-02: 3 mL

## 2023-06-02 NOTE — Progress Notes (Signed)
   Procedure Note  Patient: Steven Fletcher             Date of Birth: 12-23-1960           MRN: [id number]             Visit Date: 06/02/2023 HPI patient comes in today requesting left knee cortisone injection.  He last had a left knee injection with viscosupplementation 08/19/2022 and is done well until about 3 weeks ago.  He has no scheduled surgery for his left knee in the next 6 weeks.  He has tried conservative measures and continues to have pain.  He has known osteoarthritis of his left knee on radiograph.  Radiographs show early spurring and joint space narrowing of the left knee.  Medial joint line is most narrowed. Patient is diabetic reports good control.  Denies any fevers or chills.  He has had no injuries to the knee.  Physical exam: General well-developed well-nourished male no acute distress mood and affect appropriate.  Psych alert and oriented x 3. Left knee full extension full flexion.  Mild patellofemoral crepitus.  No instability valgus varus stressing.  No abnormal warmth erythema or effusion within the left knee. Procedures: Visit Diagnoses:  1. Primary osteoarthritis of left knee     Large Joint Inj: L knee on 06/02/2023 6:19 PM Indications: pain Details: 22 G 1.5 in needle, anterolateral approach  Arthrogram: No  Medications: 3 mL lidocaine 1 %; 40 mg methylPREDNISolone acetate 40 MG/ML Outcome: tolerated well, no immediate complications Procedure, treatment alternatives, risks and benefits explained, specific risks discussed. Consent was given by the patient. Immediately prior to procedure a time out was called to verify the correct patient, procedure, equipment, support staff and site/side marked as required. Patient was prepped and draped in the usual sterile fashion.     Plan: Will work on getting approval for viscosupplementation injection left knee and have him back once these are available.  He is good candidate for this as he has had good relief in the past with  viscosupplementation injections.

## 2023-06-03 ENCOUNTER — Telehealth: Payer: Self-pay

## 2023-06-03 NOTE — Telephone Encounter (Signed)
Left knee gel injection ?

## 2023-06-06 NOTE — Telephone Encounter (Signed)
VOB submitted for durolane, left knee 

## 2023-06-10 ENCOUNTER — Other Ambulatory Visit: Payer: Self-pay | Admitting: Cardiology

## 2023-06-10 DIAGNOSIS — I2510 Atherosclerotic heart disease of native coronary artery without angina pectoris: Secondary | ICD-10-CM

## 2023-06-23 ENCOUNTER — Other Ambulatory Visit: Payer: Self-pay

## 2023-06-23 DIAGNOSIS — M1712 Unilateral primary osteoarthritis, left knee: Secondary | ICD-10-CM

## 2023-06-23 NOTE — Telephone Encounter (Signed)
Patient scheduled for gel injection. 

## 2023-07-08 ENCOUNTER — Ambulatory Visit: Payer: No Typology Code available for payment source | Admitting: Physician Assistant

## 2023-07-08 ENCOUNTER — Encounter: Payer: Self-pay | Admitting: Physician Assistant

## 2023-07-08 DIAGNOSIS — M1712 Unilateral primary osteoarthritis, left knee: Secondary | ICD-10-CM

## 2023-07-08 MED ORDER — LIDOCAINE HCL 1 % IJ SOLN
3.0000 mL | INTRAMUSCULAR | Status: AC | PRN
Start: 2023-07-08 — End: 2023-07-08
  Administered 2023-07-08: 3 mL

## 2023-07-08 MED ORDER — SODIUM HYALURONATE 60 MG/3ML IX PRSY
60.0000 mg | PREFILLED_SYRINGE | INTRA_ARTICULAR | Status: AC | PRN
Start: 2023-07-08 — End: 2023-07-08
  Administered 2023-07-08: 60 mg via INTRA_ARTICULAR

## 2023-07-08 NOTE — Progress Notes (Signed)
   Procedure Note  Patient: SAMIIR LOEHRER             Date of Birth: 1961-01-08           MRN: [id number]             Visit Date: 07/08/2023 HPI: Mr. Newman comes in today for scheduled left knee Durolane injection.  He states that the cortisone injection gave him some relief.  He has known osteoarthritis of the left knee.  No scheduled surgery on the left knee in the next 6 months.  He had no new injury to the knee.  Physical exam: Left knee full extension full flexion.  No abnormal warmth erythema or effusion. Procedures: Visit Diagnoses:  1. Primary osteoarthritis of left knee     Large Joint Inj: L knee on 07/08/2023 3:47 PM Indications: pain Details: 22 G 1.5 in needle, anterolateral approach  Arthrogram: No  Medications: 3 mL lidocaine 1 %; 60 mg Sodium Hyaluronate 60 MG/3ML Outcome: tolerated well, no immediate complications Procedure, treatment alternatives, risks and benefits explained, specific risks discussed. Consent was given by the patient. Immediately prior to procedure a time out was called to verify the correct patient, procedure, equipment, support staff and site/side marked as required. Patient was prepped and draped in the usual sterile fashion.     Plan: He understands to wait 3 months between cortisone injections in 6 months between supplemental injections.  He will follow-up with [place] as needed.  He tolerated the injection well today.

## 2023-09-30 ENCOUNTER — Other Ambulatory Visit: Payer: Self-pay | Admitting: Cardiology

## 2023-09-30 DIAGNOSIS — I2510 Atherosclerotic heart disease of native coronary artery without angina pectoris: Secondary | ICD-10-CM

## 2023-12-17 ENCOUNTER — Telehealth: Payer: Self-pay

## 2023-12-17 ENCOUNTER — Other Ambulatory Visit (HOSPITAL_COMMUNITY): Payer: Self-pay

## 2023-12-17 NOTE — Telephone Encounter (Signed)
 Pharmacy Patient Advocate Encounter   Received notification from CoverMyMeds that prior authorization for Mounjaro  is required/requested.   Insurance verification completed.   The patient is insured through [facility] .   Per test claim: PA required; PA submitted to above mentioned insurance via CoverMyMeds Key/confirmation #/EOC Key: [id number]  Status is pending

## 2023-12-18 ENCOUNTER — Other Ambulatory Visit (HOSPITAL_COMMUNITY): Payer: Self-pay

## 2023-12-18 NOTE — Telephone Encounter (Signed)
Pharmacy Patient Advocate Encounter  Received notification from [facility] that Prior Authorization for Steven Fletcher has been APPROVED from 1.15.25 to 1.15.26. Ran test claim, Copay is $25.00. This test claim was processed through [facility]- copay amounts may vary at other pharmacies due to pharmacy/plan contracts, or as the patient moves through the different stages of their insurance plan.   PA #/Case ID/Reference #: (Key: [id number])

## 2023-12-21 ENCOUNTER — Other Ambulatory Visit: Payer: Self-pay | Admitting: Medical

## 2023-12-21 DIAGNOSIS — E781 Pure hyperglyceridemia: Secondary | ICD-10-CM

## 2023-12-21 DIAGNOSIS — E782 Mixed hyperlipidemia: Secondary | ICD-10-CM

## 2023-12-22 ENCOUNTER — Encounter: Payer: Self-pay | Admitting: Internal Medicine

## 2023-12-22 DIAGNOSIS — E782 Mixed hyperlipidemia: Secondary | ICD-10-CM

## 2023-12-22 DIAGNOSIS — E781 Pure hyperglyceridemia: Secondary | ICD-10-CM

## 2023-12-23 ENCOUNTER — Other Ambulatory Visit: Payer: Self-pay | Admitting: Medical

## 2023-12-23 ENCOUNTER — Telehealth: Payer: Self-pay

## 2023-12-23 DIAGNOSIS — E781 Pure hyperglyceridemia: Secondary | ICD-10-CM

## 2023-12-23 DIAGNOSIS — E118 Type 2 diabetes mellitus with unspecified complications: Secondary | ICD-10-CM

## 2023-12-23 DIAGNOSIS — Z125 Encounter for screening for malignant neoplasm of prostate: Secondary | ICD-10-CM

## 2023-12-23 DIAGNOSIS — E782 Mixed hyperlipidemia: Secondary | ICD-10-CM

## 2023-12-23 DIAGNOSIS — R7989 Other specified abnormal findings of blood chemistry: Secondary | ICD-10-CM

## 2023-12-23 DIAGNOSIS — Z0000 Encounter for general adult medical examination without abnormal findings: Secondary | ICD-10-CM

## 2023-12-23 DIAGNOSIS — I10 Essential (primary) hypertension: Secondary | ICD-10-CM

## 2023-12-23 NOTE — Telephone Encounter (Signed)
Pt scheduled physical for 01/28/24 and wants to come in on 01/27/24 for fasting labs. Will you put in the orders?

## 2023-12-26 MED ORDER — ROSUVASTATIN CALCIUM 40 MG PO TABS: ORAL_TABLET | ORAL | 0 refills | Status: DC

## 2023-12-26 MED ORDER — METFORMIN HCL 500 MG PO TABS: 500.0000 mg | ORAL_TABLET | Freq: Two times a day (BID) | ORAL | 0 refills | Status: DC

## 2023-12-26 MED ORDER — FENOFIBRATE 145 MG PO TABS: ORAL_TABLET | ORAL | 0 refills | Status: DC

## 2024-01-06 ENCOUNTER — Other Ambulatory Visit: Payer: Self-pay | Admitting: Cardiology

## 2024-01-06 DIAGNOSIS — I2510 Atherosclerotic heart disease of native coronary artery without angina pectoris: Secondary | ICD-10-CM

## 2024-01-27 ENCOUNTER — Other Ambulatory Visit: Payer: No Typology Code available for payment source

## 2024-01-27 DIAGNOSIS — E118 Type 2 diabetes mellitus with unspecified complications: Secondary | ICD-10-CM

## 2024-01-27 DIAGNOSIS — E782 Mixed hyperlipidemia: Secondary | ICD-10-CM

## 2024-01-27 DIAGNOSIS — R7989 Other specified abnormal findings of blood chemistry: Secondary | ICD-10-CM

## 2024-01-27 DIAGNOSIS — I10 Essential (primary) hypertension: Secondary | ICD-10-CM

## 2024-01-27 DIAGNOSIS — Z23 Encounter for immunization: Secondary | ICD-10-CM | POA: Diagnosis not present

## 2024-01-27 DIAGNOSIS — Z0000 Encounter for general adult medical examination without abnormal findings: Secondary | ICD-10-CM

## 2024-01-27 DIAGNOSIS — Z125 Encounter for screening for malignant neoplasm of prostate: Secondary | ICD-10-CM

## 2024-01-28 ENCOUNTER — Ambulatory Visit (INDEPENDENT_AMBULATORY_CARE_PROVIDER_SITE_OTHER): Payer: No Typology Code available for payment source | Admitting: Medical

## 2024-01-28 ENCOUNTER — Encounter: Payer: Self-pay | Admitting: Medical

## 2024-01-28 VITALS — BP 122/70 | HR 72 | Ht 72.0 in | Wt 240.6 lb

## 2024-01-28 DIAGNOSIS — I10 Essential (primary) hypertension: Secondary | ICD-10-CM

## 2024-01-28 DIAGNOSIS — E782 Mixed hyperlipidemia: Secondary | ICD-10-CM

## 2024-01-28 DIAGNOSIS — Z0000 Encounter for general adult medical examination without abnormal findings: Secondary | ICD-10-CM | POA: Diagnosis not present

## 2024-01-28 DIAGNOSIS — E118 Type 2 diabetes mellitus with unspecified complications: Secondary | ICD-10-CM

## 2024-01-28 DIAGNOSIS — Z23 Encounter for immunization: Secondary | ICD-10-CM

## 2024-01-28 DIAGNOSIS — B351 Tinea unguium: Secondary | ICD-10-CM | POA: Insufficient documentation

## 2024-01-28 DIAGNOSIS — I2510 Atherosclerotic heart disease of native coronary artery without angina pectoris: Secondary | ICD-10-CM | POA: Diagnosis not present

## 2024-01-28 DIAGNOSIS — E781 Pure hyperglyceridemia: Secondary | ICD-10-CM | POA: Diagnosis not present

## 2024-01-28 DIAGNOSIS — L989 Disorder of the skin and subcutaneous tissue, unspecified: Secondary | ICD-10-CM

## 2024-01-28 MED ORDER — TERBINAFINE HCL 250 MG PO TABS: 250.0000 mg | ORAL_TABLET | Freq: Every day | ORAL | 0 refills | Status: DC

## 2024-01-28 NOTE — Progress Notes (Unsigned)
 Subjective:   HPI  Steven Fletcher is a 63 y.o. male who presents for a complete physical.   Chief Complaint  Patient presents with   Annual Exam    Cpe, done blood work yesterday    Concerns: Here for well visit.  He had blood work fasting yesterday.  He has a lesion on his right middle finger that is annoying and tender.  He wants this removed  He notes concerns about toenail fungus  He saw his eye doctor last month for diabetic eye exam.  He has unfortunately gained some weight since last year.  He is compliant with his cholesterol medications Crestor, fish oil and fenofibrate and aspirin without complaint  He is compliant with his diabetes medicine, metformin. no recent diabetic issues.  Not checking blood sugars   No other c/o  Reviewed their medical, surgical, family, social, medication, and allergy history and updated chart as appropriate.  Past Medical History:  Diagnosis Date   Agatston coronary artery calcium score greater than 400 08/21/2020   Allergy    seasonal    Arthritis of left knee    Back pain    intermittent pain, sees Dr. Atilano Median, Chiropractic   Family history of premature CAD    Hypertension    Impaired fasting blood sugar    Mixed dyslipidemia 2005   hereditary   Obesity    Wears glasses     Past Surgical History:  Procedure Laterality Date   COLONOSCOPY  2010   [place]; normal, repeat 2020   COLONOSCOPY  06/2019   benign polyps, diverticulosis, Dr. Marsa Aris, repeat 10 years   elbow Right    arthroscopic for cartilege 1980, 1984, 1989   ELBOW SURGERY Right 1990   move nerve   KNEE CARTILAGE SURGERY Bilateral 2003, 2005     x 2, R 2005, L 2003   TONSILLECTOMY     WISDOM TOOTH EXTRACTION     wrist sugery Right 2000    bone spur    Social History   Socioeconomic History   Marital status: Divorced    Spouse name: Not on file   Number of children: 1   Years of education: Not on file   Highest education level: Not on file   Occupational History   Occupation: Pensions consultant for [employer]    Employer: [employer]  Tobacco Use   Smoking status: Never   Smokeless tobacco: Never  Vaping Use   Vaping status: Never Used  Substance and Sexual Activity   Alcohol use: Not Currently    Comment: occasional   Drug use: No   Sexual activity: Not Currently  Other Topics Concern   Not on file  Social History Narrative   Exercise - walks   Working [employer], Pensions consultant.  1 child.   No significant other.  01/2024   Social Drivers of Health   Financial Resource Strain: Low Risk  (01/28/2024)   Overall Financial Resource Strain (CARDIA)    Difficulty of Paying Living Expenses: Not hard at all  Food Insecurity: No Food Insecurity (01/28/2024)   Hunger Vital Sign    Worried About Running Out of Food in the Last Year: Never true    Ran Out of Food in the Last Year: Never true  Transportation Needs: No Transportation Needs (01/28/2024)   PRAPARE - Administrator, Civil Service (Medical): No    Lack of Transportation (Non-Medical): No  Physical Activity: Insufficiently Active (01/28/2024)   Exercise Vital Sign  Days of Exercise per Week: 4 days    Minutes of Exercise per Session: 30 min  Stress: No Stress Concern Present (01/28/2024)   [employer] of Occupational Health - Occupational Stress Questionnaire    Feeling of Stress : Not at all  Social Connections: Unknown (04/15/2022)   Received from [facility]   Social Network    Social Network: Not on file  Intimate Partner Violence: Not At Risk (01/28/2024)   Humiliation, Afraid, Rape, and Kick questionnaire    Fear of Current or Ex-Partner: No    Emotionally Abused: No    Physically Abused: No    Sexually Abused: No    Family History  Problem Relation Age of Onset   Alzheimer's disease Mother    Hyperlipidemia Mother    Hypertension Father    Other Father        died of old age   Cancer Sister        breast    Hyperlipidemia Sister    Heart disease Brother 2       died of MI   Hyperlipidemia Brother    Diabetes Maternal Grandmother    Diabetes Paternal Grandmother    Stroke Neg Hx    Colon cancer Neg Hx    Colon polyps Neg Hx    Esophageal cancer Neg Hx    Ulcerative colitis Neg Hx    Stomach cancer Neg Hx    Rectal cancer Neg Hx      Current Outpatient Medications:    aspirin (ASPIRIN LOW DOSE) 81 MG EC tablet, Take 1 tablet (81 mg total) by mouth daily. Swallow whole., Disp: 90 tablet, Rfl: 3   cetirizine (ZYRTEC) 10 MG chewable tablet, Chew 10 mg by mouth daily., Disp: , Rfl:    fenofibrate (TRICOR) 145 MG tablet, TAKE 1 TABLET(145 MG) BY MOUTH DAILY, Disp: 30 tablet, Rfl: 0   icosapent Ethyl (VASCEPA) 1 g capsule, TAKE 2 CAPSULES(2 GRAMS) BY MOUTH TWICE DAILY, Disp: 360 capsule, Rfl: 1   metFORMIN (GLUCOPHAGE) 500 MG tablet, Take 1 tablet (500 mg total) by mouth 2 (two) times daily., Disp: 60 tablet, Rfl: 0   rosuvastatin (CRESTOR) 40 MG tablet, TAKE 1 TABLET(40 MG) BY MOUTH DAILY, Disp: 30 tablet, Rfl: 0   terbinafine (LAMISIL) 250 MG tablet, Take 1 tablet (250 mg total) by mouth daily., Disp: 45 tablet, Rfl: 0  No Known Allergies  Review of Systems  Constitutional:  Negative for chills, fever, malaise/fatigue and weight loss.  HENT:  Negative for congestion, ear pain, hearing loss, sore throat and tinnitus.   Eyes:  Negative for blurred vision, pain and redness.  Respiratory:  Negative for cough, hemoptysis and shortness of breath.   Cardiovascular:  Negative for chest pain, palpitations, orthopnea, claudication and leg swelling.  Gastrointestinal:  Negative for abdominal pain, blood in stool, constipation, diarrhea, nausea and vomiting.  Genitourinary:  Negative for dysuria, flank pain, frequency, hematuria and urgency.  Musculoskeletal:  Negative for falls, joint pain and myalgias.  Skin:  Negative for itching and rash.       Skin lesion, toenail fungus  Neurological:   Negative for dizziness, tingling, speech change, weakness and headaches.  Endo/Heme/Allergies:  Negative for polydipsia. Does not bruise/bleed easily.  Psychiatric/Behavioral:  Negative for depression and memory loss. The patient is not nervous/anxious and does not have insomnia.         Objective:   Physical Exam  BP 122/70   Pulse 72   Ht 6' (1.829  m)   Wt 240 lb 9.6 oz (109.1 kg)   BMI 32.63 kg/m   BP Readings from Last 3 Encounters:  01/28/24 122/70  05/19/23 136/86  03/10/23 110/70   Wt Readings from Last 3 Encounters:  01/28/24 240 lb 9.6 oz (109.1 kg)  05/19/23 222 lb 3.2 oz (100.8 kg)  03/10/23 224 lb 9.6 oz (101.9 kg)   General appearance: alert, no distress, WD/WN, white male Skin: Right middle finger distal phalanx dorsally with a 3 mm diameter pinkish lesion slightly raised, scattered macules, no worrisome lesions Neck: supple, no lymphadenopathy, no thyromegaly, no masses, normal ROM, no bruits Chest: non tender, normal shape and expansion Heart: RRR, normal S1, S2, no murmurs Lungs: CTA bilaterally, no wheezes, rhonchi, or rales Abdomen: +bs, soft, bulge in upper abdomen c/w diastasis recti, non tender, non distended, no masses, no hepatomegaly, no splenomegaly, no bruits Back: non tender, normal ROM, no scoliosis Musculoskeletal: extremities non tender, no obvious deformity, normal ROM throughout Extremities: no edema, no cyanosis, no clubbing Pulses: 2+ symmetric, upper and lower extremities, normal cap refill Neurological: alert, oriented x 3, CN2-12 intact, strength normal upper extremities and lower extremities, sensation normal throughout, DTRs 2+ throughout, no cerebellar signs, gait normal Psychiatric: normal affect, behavior normal, pleasant  GU/rectal -deferred   Diabetic Foot Exam - Simple   Simple Foot Form Diabetic Foot exam was performed with the following findings: Yes 01/28/2024  5:07 PM  Visual Inspection See comments: Yes Sensation  Testing Intact to touch and monofilament testing bilaterally: Yes Pulse Check Posterior Tibialis and Dorsalis pulse intact bilaterally: Yes Comments Thickened yellowish-orange toenails for several toenails on both feet this particular the great toenails      Assessment and Plan :    Encounter Diagnoses  Name Primary?   Routine general medical examination at a health care facility Yes   Needs flu shot    COVID-19 vaccine administered    Onychomycosis    Coronary artery disease involving native coronary artery of native heart without angina pectoris    Essential hypertension    Hypertriglyceridemia    Mixed hyperlipidemia    Type II diabetes mellitus with complication (HCC)    Skin lesion     Physical exam - discussed and counseled on healthy lifestyle, diet, exercise, preventative care, vaccinations, sick and well care, proper use of emergency dept and after hours care, and addressed their concerns.    Health screening: See your eye doctor yearly for routine vision care. See your dentist yearly for routine dental care including hygiene visits twice yearly.   Cancer screening Colonoscopy:  Reviewed colonoscopy on file that is up to date from 2020, due repeat in 10 years. Hemoccult negative 2024.  Discussed PSA, prostate exam, and prostate cancer screening risks/benefits.      Vaccinations: Immunization History  Administered Date(s) Administered   Influenza,inj,Quad PF,6+ Mos 01/01/2023   Influenza-Unspecified 10/02/2016, 10/02/2018, 09/26/2021   PFIZER(Purple Top)SARS-COV-2 Vaccination 02/04/2020, 03/09/2020   PNEUMOCOCCAL CONJUGATE-20 10/17/2021   Pfizer Covid-19 Vaccine Bivalent Booster 36yrs & up 09/26/2021   Pfizer(Comirnaty)Fall Seasonal Vaccine 12 years and older 01/01/2023   Tdap 08/02/2014   Zoster Recombinant(Shingrix) 06/28/2020, 08/29/2020   Counseled on the influenza virus vaccine.  Vaccine information sheet given.  Influenza vaccine given after consent  obtained.  Counseled on the Covid virus vaccine.  Vaccine information sheet given.  Covid vaccine given after consent obtained.   Separate significant chronic issues discussed  Hyperlipidemia, CAD-labs reviewed from yesterday, continue fenofibrate 145 mg daily, Vascepa fish  oil 2 capsules twice daily, Crestor 40 mg daily, aspirin 81 mg daily  Obesity-counseled on healthy diet and exercise changes  Diabetes type 2-continue metformin 500 mg twice daily, labs reviewed from yesterday  Onychomycosis-discussed findings, options for medications.  Discussed risk and benefits of medication.  Begin Lamisil oral tablet  Finger lesion right hand middle finger distal phalanx dorsally with a 3 mm raised pinkish lesion suggestive of granuloma or wart.  Discussed possible complications, risk and benefits and aftercare.  He agreed to cryotherapy.  Use cryotherapy to treat the single lesion.     [place] was seen today for annual exam.  Diagnoses and all orders for this visit:  Routine general medical examination at a health care facility -     Cancel: Urinalysis, Routine w reflex microscopic -     Cancel: Urinalysis, Routine w reflex microscopic  Needs flu shot -     Flu vaccine trivalent PF, 6mos and older(Flulaval,Afluria,Fluarix,Fluzone)  COVID-19 vaccine administered -     Pfizer Comirnaty Covid -19 Vaccine 26yrs and older  Onychomycosis  Coronary artery disease involving native coronary artery of native heart without angina pectoris  Essential hypertension  Hypertriglyceridemia  Mixed hyperlipidemia  Type II diabetes mellitus with complication (HCC)  Skin lesion  Other orders -     terbinafine (LAMISIL) 250 MG tablet; Take 1 tablet (250 mg total) by mouth daily.    F/u pending labs

## 2024-01-29 LAB — COMPREHENSIVE METABOLIC PANEL
ALT: 21 IU/L (ref 0–44)
AST: 29 IU/L (ref 0–40)
Albumin: 4.8 g/dL (ref 3.9–4.9)
Alkaline Phosphatase: 72 IU/L (ref 44–121)
BUN/Creatinine Ratio: 20 (ref 10–24)
BUN: 20 mg/dL (ref 8–27)
Bilirubin Total: 0.5 mg/dL (ref 0.0–1.2)
CO2: 23 mmol/L (ref 20–29)
Calcium: 10 mg/dL (ref 8.6–10.2)
Chloride: 103 mmol/L (ref 96–106)
Creatinine, Ser: 1.02 mg/dL (ref 0.76–1.27)
Globulin, Total: 2.5 g/dL (ref 1.5–4.5)
Glucose: 105 mg/dL — ABNORMAL HIGH (ref 70–99)
Potassium: 4.7 mmol/L (ref 3.5–5.2)
Sodium: 140 mmol/L (ref 134–144)
Total Protein: 7.3 g/dL (ref 6.0–8.5)
eGFR: 82 mL/min/{1.73_m2} (ref >59)

## 2024-01-29 LAB — CBC
Hematocrit: 46.5 % (ref 37.5–51.0)
Hemoglobin: 15.3 g/dL (ref 13.0–17.7)
MCH: 28.2 pg (ref 26.6–33.0)
MCHC: 32.9 g/dL (ref 31.5–35.7)
MCV: 86 fL (ref 79–97)
Platelets: 274 10*3/uL (ref 150–450)
RBC: 5.42 x10E6/uL (ref 4.14–5.80)
RDW: 12.8 % (ref 11.6–15.4)
WBC: 6.3 10*3/uL (ref 3.4–10.8)

## 2024-01-29 LAB — LIPID PANEL
Chol/HDL Ratio: 3.7 ratio (ref 0.0–5.0)
Cholesterol, Total: 123 mg/dL (ref 100–199)
HDL: 33 mg/dL — ABNORMAL LOW (ref >39)
LDL Chol Calc (NIH): 65 mg/dL (ref 0–99)
Triglycerides: 145 mg/dL (ref 0–149)
VLDL Cholesterol Cal: 25 mg/dL (ref 5–40)

## 2024-01-29 LAB — HEMOGLOBIN A1C
Est. average glucose Bld gHb Est-mCnc: 134 mg/dL
Hgb A1c MFr Bld: 6.3 % — ABNORMAL HIGH (ref 4.8–5.6)

## 2024-01-29 LAB — TESTOSTERONE: Testosterone: 275 ng/dL (ref 264–916)

## 2024-01-29 LAB — PSA: Prostate Specific Ag, Serum: 2.6 ng/mL (ref 0.0–4.0)

## 2024-01-29 LAB — TSH: TSH: 3.28 u[IU]/mL (ref 0.450–4.500)

## 2024-01-29 LAB — MICROALBUMIN, URINE: Microalbumin, Urine: 11.8 ug/mL

## 2024-02-05 ENCOUNTER — Other Ambulatory Visit: Payer: Self-pay | Admitting: Medical

## 2024-02-05 DIAGNOSIS — E781 Pure hyperglyceridemia: Secondary | ICD-10-CM

## 2024-02-05 DIAGNOSIS — E782 Mixed hyperlipidemia: Secondary | ICD-10-CM

## 2024-03-10 ENCOUNTER — Ambulatory Visit (INDEPENDENT_AMBULATORY_CARE_PROVIDER_SITE_OTHER): Payer: No Typology Code available for payment source | Admitting: Medical

## 2024-03-10 VITALS — BP 124/80 | HR 88 | Wt 250.0 lb

## 2024-03-10 DIAGNOSIS — B351 Tinea unguium: Secondary | ICD-10-CM

## 2024-03-10 DIAGNOSIS — Z79899 Other long term (current) drug therapy: Secondary | ICD-10-CM | POA: Diagnosis not present

## 2024-03-10 DIAGNOSIS — E118 Type 2 diabetes mellitus with unspecified complications: Secondary | ICD-10-CM | POA: Diagnosis not present

## 2024-03-10 DIAGNOSIS — L989 Disorder of the skin and subcutaneous tissue, unspecified: Secondary | ICD-10-CM

## 2024-03-10 NOTE — Progress Notes (Signed)
 Subjective:  Steven Fletcher is a 63 y.o. male who presents for Chief Complaint  Patient presents with   Follow-up    Follow-up on toenail fungus     Here for follow-up on toenail fungus.  Last visit we started Lamisil oral and he has had significant improvement.  New fresh nail is about the 40% of the nail at this point.  He is happy with the improvement so far.  No side effects noted.  Last visit we did cryotherapy.  Today lesion on his right hand.  Initially it went away, almost seemed flat, but over the last 2 weeks it grew back.    No other aggravating or relieving factors.    No other c/o.  The following portions of the patient's history were reviewed and updated as appropriate: allergies, current medications, past family history, past medical history, past social history, past surgical history and problem list.  ROS Otherwise as in subjective above  Objective: BP 124/80   Pulse 88   Wt 250 lb (113.4 kg)   BMI 33.91 kg/m   General appearance: alert, no distress, well developed, well nourished Skin: Great toenails showed new fresh nail about 40% of the nail with the older fungus appearing nail growing outwards. Right index finger dorsal surface over the DIP with about a 3 mm raised pink lesion similar to last visit    Assessment: Encounter Diagnoses  Name Primary?   Onychomycosis Yes   High risk medication use    Skin lesion    Type II diabetes mellitus with complication (HCC)      Plan: Onychomycosis - significant improvement so far on Lamisil oral.  Plan to continue oral Lamisil 30-45 more days.  High risk medication use - lab as below for monitoring  Skin lesions - refer to dermatology for multiple skin concerns  Diabetes - last visit microalbumin ration was not completed.   Update lab today  We will again try to request a prior eye doctor visit and diabetic eye exam from earlier this year.  Juel was seen today for follow-up.  Diagnoses and all orders  for this visit:  Onychomycosis -     Comprehensive metabolic panel with GFR  High risk medication use -     Comprehensive metabolic panel with GFR  Skin lesion -     Ambulatory referral to Dermatology  Type II diabetes mellitus with complication (HCC) -     Microalbumin/Creatinine Ratio, Urine    Follow up: pending labs

## 2024-03-11 ENCOUNTER — Other Ambulatory Visit: Payer: Self-pay | Admitting: Medical

## 2024-03-11 DIAGNOSIS — E781 Pure hyperglyceridemia: Secondary | ICD-10-CM

## 2024-03-11 DIAGNOSIS — E782 Mixed hyperlipidemia: Secondary | ICD-10-CM

## 2024-03-11 LAB — COMPREHENSIVE METABOLIC PANEL WITH GFR
ALT: 26 IU/L (ref 0–44)
AST: 29 IU/L (ref 0–40)
Albumin: 4.9 g/dL (ref 3.9–4.9)
Alkaline Phosphatase: 87 IU/L (ref 44–121)
BUN/Creatinine Ratio: 22 (ref 10–24)
BUN: 19 mg/dL (ref 8–27)
Bilirubin Total: 0.3 mg/dL (ref 0.0–1.2)
CO2: 20 mmol/L (ref 20–29)
Calcium: 10.1 mg/dL (ref 8.6–10.2)
Chloride: 102 mmol/L (ref 96–106)
Creatinine, Ser: 0.88 mg/dL (ref 0.76–1.27)
Globulin, Total: 2.8 g/dL (ref 1.5–4.5)
Glucose: 114 mg/dL — ABNORMAL HIGH (ref 70–99)
Potassium: 4.6 mmol/L (ref 3.5–5.2)
Sodium: 136 mmol/L (ref 134–144)
Total Protein: 7.7 g/dL (ref 6.0–8.5)
eGFR: 95 mL/min/{1.73_m2} (ref >59)

## 2024-03-11 MED ORDER — FENOFIBRATE 145 MG PO TABS: ORAL_TABLET | ORAL | 2 refills | Status: DC

## 2024-03-11 MED ORDER — TERBINAFINE HCL 250 MG PO TABS: 250.0000 mg | ORAL_TABLET | Freq: Every day | ORAL | 0 refills | Status: DC

## 2024-03-11 MED ORDER — ROSUVASTATIN CALCIUM 40 MG PO TABS: ORAL_TABLET | ORAL | 2 refills | Status: DC

## 2024-03-11 NOTE — Progress Notes (Signed)
 Results sent through MyChart

## 2024-03-12 LAB — MICROALBUMIN / CREATININE URINE RATIO
Creatinine, Urine: 38.5 mg/dL
Microalb/Creat Ratio: 169 mg/g{creat} — ABNORMAL HIGH (ref 0–29)
Microalbumin, Urine: 65 ug/mL

## 2024-05-19 ENCOUNTER — Ambulatory Visit: Payer: Self-pay | Admitting: Cardiology

## 2024-05-22 ENCOUNTER — Ambulatory Visit
Admission: RE | Admit: 2024-05-22 | Discharge: 2024-05-22 | Disposition: A | Discharge status: Home / Self Care | Payer: Self-pay | Source: Ambulatory Visit | Attending: Emergency Medicine

## 2024-05-22 VITALS — BP 135/82 | HR 82 | Temp 98.0°F | Resp 17

## 2024-05-22 DIAGNOSIS — J019 Acute sinusitis, unspecified: Secondary | ICD-10-CM

## 2024-05-22 LAB — POC SARS CORONAVIRUS 2 AG -  ED: SARS Coronavirus 2 Ag: NEGATIVE

## 2024-05-22 MED ORDER — AMOXICILLIN-POT CLAVULANATE 875-125 MG PO TABS: 1.0000 | ORAL_TABLET | Freq: Two times a day (BID) | ORAL | 0 refills | Status: DC

## 2024-05-22 NOTE — ED Triage Notes (Signed)
 Pt states he started feeling bad Sunday. He has had fever, ear fullness, fatigue, body aches, and congestion.

## 2024-05-22 NOTE — Discharge Instructions (Addendum)
 Take the Augmentin twice daily with food for the next 7 days.  Over-the-counter Flonase and sinus rinses with filtered water can help with nasal congestion.  For fever alternate between 600 mg of ibuprofen and 500 mg of Tylenol every 4-6 hours as needed.  Symptoms should improve over the next few days, if no improvement or any changes please follow-up with your primary care provider or return to clinic for reevaluation.

## 2024-05-22 NOTE — ED Provider Notes (Signed)
 [name]    CSN: [id number] Arrival date & time: 05/22/24  0851      History   Chief Complaint Chief Complaint  Patient presents with   Influenza    Entered by patient    HPI Steven Fletcher is a 63 y.o. male.   Patient presents to the clinic for concern of right ear fullness, nasal congestion, rhinorrhea, postnasal drip and fatigue.  Initially symptoms started 7 days ago with fever and cold sweats.  He did have a cough a few days ago, which has since improved.  Has not had any wheezing or shortness of breath.  No known sick contacts.  Has been taking Mucinex for the congestion and Tylenol for fevers.  Woke up this morning at the wrist and could not hear out of his right ear so he decided to come into clinic.  History of hypertension, obesity and dyslipidemia.  The history is provided by the patient and medical records.  Influenza   Past Medical History:  Diagnosis Date   Agatston coronary artery calcium  score greater than 400 08/21/2020   Allergy    seasonal    Arthritis of left knee    Back pain    intermittent pain, sees Dr. Jame, Chiropractic   Family history of premature CAD    Hypertension    Impaired fasting blood sugar    Mixed dyslipidemia 2005   hereditary   Obesity    Wears glasses     Patient Active Problem List   Diagnosis Date Noted   Routine general medical examination at a health care facility 01/28/2024   Onychomycosis 01/28/2024   Type II diabetes mellitus with complication (HCC) 01/01/2023   Hypertriglyceridemia 05/30/2021   Mixed hyperlipidemia 02/26/2021   Coronary artery disease involving native coronary artery of native heart without angina pectoris 02/25/2021   Elevated coronary artery calcium  score 08/21/2020   Metabolic syndrome 07/27/2020   Screening for prostate cancer 06/28/2020   Plantar fasciitis 06/28/2020   Diastasis recti 12/04/2016   Deviation of finger of left hand 12/04/2016   Screen for colon cancer  12/04/2016   Vaccine counseling 12/04/2016   Ganglion cyst 07/08/2016   Low testosterone  07/04/2016   Hypogonadism in male 07/04/2016   Encounter for health maintenance examination in adult 07/04/2016   Essential hypertension 06/07/2015   Obesity 11/10/2014   Family history of premature CAD 11/10/2014    Past Surgical History:  Procedure Laterality Date   COLONOSCOPY  2010   [place] ; normal, repeat 2020   COLONOSCOPY  06/2019   benign polyps, diverticulosis, Dr. Gustav Mcgee, repeat 10 years   elbow Right    arthroscopic for cartilege 1980, 1984, 1989   ELBOW SURGERY Right 1990   move nerve   KNEE CARTILAGE SURGERY Bilateral 2003, 2005     x 2, R 2005, L 2003   TONSILLECTOMY     WISDOM TOOTH EXTRACTION     wrist sugery Right 2000    bone spur       Home Medications    Prior to Admission medications   Medication Sig Start Date End Date Taking? Authorizing Provider  amoxicillin -clavulanate (AUGMENTIN) 875-125 MG tablet Take 1 tablet by mouth every 12 (twelve) hours. 05/22/24  Yes Mahira Petras  N, FNP  aspirin  (ASPIRIN  LOW DOSE) 81 MG EC tablet Take 1 tablet (81 mg total) by mouth daily. Swallow whole. 06/29/20   Tysinger, Alm RAMAN, PA-C  cetirizine (ZYRTEC) 10 MG chewable tablet Chew 10 mg by mouth daily.  [provider]  fenofibrate  (TRICOR ) 145 MG tablet TAKE 1 TABLET(145 MG) BY MOUTH DAILY 03/11/24   Tysinger, Alm RAMAN, PA-C  icosapent  Ethyl (VASCEPA ) 1 g capsule TAKE 2 CAPSULES(2 GRAMS) BY MOUTH TWICE DAILY 01/08/24   Patwardhan, Manish J, MD  metFORMIN  (GLUCOPHAGE ) 500 MG tablet TAKE 1 TABLET(500 MG) BY MOUTH TWICE DAILY 02/05/24   Tysinger, Alm RAMAN, PA-C  rosuvastatin  (CRESTOR ) 40 MG tablet TAKE 1 TABLET(40 MG) BY MOUTH DAILY 03/11/24   Tysinger, Alm RAMAN, PA-C  terbinafine  (LAMISIL ) 250 MG tablet Take 1 tablet (250 mg total) by mouth daily. 03/11/24   Tysinger, Alm RAMAN, PA-C    Family History Family History  Problem Relation Age of Onset   Alzheimer's  disease Mother    Hyperlipidemia Mother    Hypertension Father    Other Father        died of old age   Cancer Sister        breast   Hyperlipidemia Sister    Heart disease Brother 103       died of MI   Hyperlipidemia Brother    Diabetes Maternal Grandmother    Diabetes Paternal Grandmother    Stroke Neg Hx    Colon cancer Neg Hx    Colon polyps Neg Hx    Esophageal cancer Neg Hx    Ulcerative colitis Neg Hx    Stomach cancer Neg Hx    Rectal cancer Neg Hx     Social History Social History   Tobacco Use   Smoking status: Never   Smokeless tobacco: Never  Vaping Use   Vaping status: Never Used  Substance Use Topics   Alcohol use: Not Currently    Comment: occasional   Drug use: No     Allergies   Patient has no known allergies.   Review of Systems Review of Systems   Physical Exam Triage Vital Signs ED Triage Vitals  Encounter Vitals Group     BP 05/22/24 0855 135/82     Girls Systolic BP Percentile --      Girls Diastolic BP Percentile --      Boys Systolic BP Percentile --      Boys Diastolic BP Percentile --      Pulse Rate 05/22/24 0855 82     Resp 05/22/24 0855 17     Temp 05/22/24 0855 98 F (36.7 C)     Temp Source 05/22/24 0855 Oral     SpO2 05/22/24 0855 94 %     Weight --      Height --      Head Circumference --      Peak Flow --      Pain Score 05/22/24 0900 0     Pain Loc --      Pain Education --      Exclude from Growth Chart --    No data found.  Updated Vital Signs BP 135/82 (BP Location: Right Arm)   Pulse 82   Temp 98 F (36.7 C) (Oral)   Resp 17   SpO2 94%   Visual Acuity Right Eye Distance:   Left Eye Distance:   Bilateral Distance:    Right Eye Near:   Left Eye Near:    Bilateral Near:     Physical Exam Vitals and nursing note reviewed.  Constitutional:      Appearance: Normal appearance.  HENT:     Head: Normocephalic and atraumatic.     Right Ear: Ear canal and  external ear normal. A middle ear  effusion is present.     Left Ear: Ear canal and external ear normal. A middle ear effusion is present.     Nose: Congestion and rhinorrhea present.     Mouth/Throat:     Mouth: Mucous membranes are moist.     Pharynx: Posterior oropharyngeal erythema present.   Eyes:     Conjunctiva/sclera: Conjunctivae normal.    Cardiovascular:     Rate and Rhythm: Normal rate and regular rhythm.     Heart sounds: Normal heart sounds. No murmur heard. Pulmonary:     Effort: Pulmonary effort is normal. No respiratory distress.     Breath sounds: Normal breath sounds.   Musculoskeletal:        General: Normal range of motion.   Skin:    General: Skin is warm and dry.   Neurological:     General: No focal deficit present.     Mental Status: He is alert.   Psychiatric:        Mood and Affect: Mood normal.      UC Treatments / Results  Labs (all labs ordered are listed, but only abnormal results are displayed) Labs Reviewed  POC SARS CORONAVIRUS 2 AG -  ED    EKG   Radiology No results found.  Procedures Procedures (including critical care time)  Medications Ordered in UC Medications - No data to display  Initial Impression / Assessment and Plan / UC Course  I have reviewed the triage vital signs and the nursing notes.  Pertinent labs & imaging results that were available during my care of the patient were reviewed by me and considered in my medical decision making (see chart for details).  Vitals and triage reviewed, patient is hemodynamically stable.  Lungs vesicular, heart with regular rate and rhythm.  Congestion, rhinorrhea and postnasal drip present on physical exam.  Bilateral middle ear effusions.   POC COVID testing negative.  Patient with congestion, rhinorrhea and no improvement over the past 7 days.  Concerning for bacterial ideology, will cover for sinusitis with Augmentin.  Symptomatic management discussed.  Plan of care, follow-up care return precautions  given, no questions at this time.    Final Clinical Impressions(s) / UC Diagnoses   Final diagnoses:  Acute non-recurrent sinusitis, unspecified location     Discharge Instructions      Take the Augmentin twice daily with food for the next 7 days.  Over-the-counter Flonase and sinus rinses with filtered water can help with nasal congestion.  For fever alternate between 600 mg of ibuprofen and 500 mg of Tylenol every 4-6 hours as needed.  Symptoms should improve over the next few days, if no improvement or any changes please follow-up with your primary care provider or return to clinic for reevaluation.    ED Prescriptions     Medication Sig Dispense Auth. Provider   amoxicillin -clavulanate (AUGMENTIN) 875-125 MG tablet Take 1 tablet by mouth every 12 (twelve) hours. 14 tablet Dreama, Afnan Emberton  N, FNP      PDMP not reviewed this encounter.   Dreama, Krystalyn Kubota  N, FNP 05/22/24 [phone]

## 2024-07-26 ENCOUNTER — Other Ambulatory Visit: Payer: Self-pay | Admitting: Cardiology

## 2024-07-26 DIAGNOSIS — I2510 Atherosclerotic heart disease of native coronary artery without angina pectoris: Secondary | ICD-10-CM

## 2024-07-27 ENCOUNTER — Other Ambulatory Visit: Payer: Self-pay | Admitting: Cardiology

## 2024-07-27 DIAGNOSIS — I2510 Atherosclerotic heart disease of native coronary artery without angina pectoris: Secondary | ICD-10-CM

## 2024-08-01 ENCOUNTER — Encounter: Payer: Self-pay | Admitting: Emergency Medicine

## 2024-08-01 ENCOUNTER — Ambulatory Visit
Admission: EM | Admit: 2024-08-01 | Discharge: 2024-08-01 | Disposition: A | Discharge status: Home / Self Care | Attending: Internal Medicine | Admitting: Internal Medicine

## 2024-08-01 DIAGNOSIS — L255 Unspecified contact dermatitis due to plants, except food: Secondary | ICD-10-CM

## 2024-08-01 MED ORDER — PREDNISONE 20 MG PO TABS: 40.0000 mg | ORAL_TABLET | Freq: Every day | ORAL | 0 refills | Status: AC

## 2024-08-01 NOTE — ED Provider Notes (Signed)
 [name]    CSN: [id number] Arrival date & time: 08/01/24  9165      History   Chief Complaint Chief Complaint  Patient presents with   Poison Ivy    HPI Steven Fletcher is a 63 y.o. male.   Steven Fletcher is a 63 y.o. male presenting for chief complaint of Poison Ivy rash to the arms and legs that started 5 days ago.  He had a known exposure to poison ivy 7 days ago.  Rash started to the left arm as 2 pimple lesions and has now spread to the right arm and right leg.  He has attempted to avoid itching the rash to prevent spread but states there is still some red lesions popping up to the forearms.  Rash is intensely pruritic.  Denies redness and swelling/purulent drainage surrounding the rash.  Denies fever, chills, body aches, and recent antibiotic or steroid use.  He is using calamine lotion with some relief. Denies history of diabetes.  He takes metformin  due to history of impaired fasting blood sugar.     Past Medical History:  Diagnosis Date   Agatston coronary artery calcium  score greater than 400 08/21/2020   Allergy    seasonal    Arthritis of left knee    Back pain    intermittent pain, sees Dr. Jame, Chiropractic   Family history of premature CAD    Hypertension    Impaired fasting blood sugar    Mixed dyslipidemia 2005   hereditary   Obesity    Wears glasses     Patient Active Problem List   Diagnosis Date Noted   Routine general medical examination at a health care facility 01/28/2024   Onychomycosis 01/28/2024   Type II diabetes mellitus with complication (HCC) 01/01/2023   Hypertriglyceridemia 05/30/2021   Mixed hyperlipidemia 02/26/2021   Coronary artery disease involving native coronary artery of native heart without angina pectoris 02/25/2021   Elevated coronary artery calcium  score 08/21/2020   Metabolic syndrome 07/27/2020   Screening for prostate cancer 06/28/2020   Plantar fasciitis 06/28/2020   Diastasis recti 12/04/2016    Deviation of finger of left hand 12/04/2016   Screen for colon cancer 12/04/2016   Vaccine counseling 12/04/2016   Ganglion cyst 07/08/2016   Low testosterone  07/04/2016   Hypogonadism in male 07/04/2016   Encounter for health maintenance examination in adult 07/04/2016   Essential hypertension 06/07/2015   Obesity 11/10/2014   Family history of premature CAD 11/10/2014    Past Surgical History:  Procedure Laterality Date   COLONOSCOPY  2010   [place] ; normal, repeat 2020   COLONOSCOPY  06/2019   benign polyps, diverticulosis, Dr. Gustav Mcgee, repeat 10 years   elbow Right    arthroscopic for cartilege 1980, 1984, 1989   ELBOW SURGERY Right 1990   move nerve   KNEE CARTILAGE SURGERY Bilateral 2003, 2005     x 2, R 2005, L 2003   TONSILLECTOMY     WISDOM TOOTH EXTRACTION     wrist sugery Right 2000    bone spur       Home Medications    Prior to Admission medications   Medication Sig Start Date End Date Taking? Authorizing Provider  predniSONE  (DELTASONE ) 20 MG tablet Take 2 tablets (40 mg total) by mouth daily with breakfast for 5 days. 08/01/24 08/06/24 Yes Enedelia Dorna HERO, FNP  amoxicillin -clavulanate (AUGMENTIN ) 875-125 MG tablet Take 1 tablet by mouth every 12 (twelve) hours. Patient  not taking: Reported on 08/01/2024 05/22/24   Dreama, [place]  N, FNP  aspirin  (ASPIRIN  LOW DOSE) 81 MG EC tablet Take 1 tablet (81 mg total) by mouth daily. Swallow whole. 06/29/20   Tysinger, Alm RAMAN, PA-C  cetirizine (ZYRTEC) 10 MG chewable tablet Chew 10 mg by mouth daily.    [provider]  fenofibrate  (TRICOR ) 145 MG tablet TAKE 1 TABLET(145 MG) BY MOUTH DAILY 03/11/24   Tysinger, Alm RAMAN, PA-C  icosapent  Ethyl (VASCEPA ) 1 g capsule TAKE 2 CAPSULES(2 GRAMS) BY MOUTH TWICE DAILY 07/27/24   Patwardhan, Manish J, MD  metFORMIN  (GLUCOPHAGE ) 500 MG tablet TAKE 1 TABLET(500 MG) BY MOUTH TWICE DAILY 02/05/24   Tysinger, Alm RAMAN, PA-C  rosuvastatin  (CRESTOR ) 40 MG tablet TAKE 1  TABLET(40 MG) BY MOUTH DAILY 03/11/24   Tysinger, Alm RAMAN, PA-C  terbinafine  (LAMISIL ) 250 MG tablet Take 1 tablet (250 mg total) by mouth daily. 03/11/24   Tysinger, Alm RAMAN, PA-C    Family History Family History  Problem Relation Age of Onset   Alzheimer's disease Mother    Hyperlipidemia Mother    Hypertension Father    Other Father        died of old age   Cancer Sister        breast   Hyperlipidemia Sister    Heart disease Brother 71       died of MI   Hyperlipidemia Brother    Diabetes Maternal Grandmother    Diabetes Paternal Grandmother    Stroke Neg Hx    Colon cancer Neg Hx    Colon polyps Neg Hx    Esophageal cancer Neg Hx    Ulcerative colitis Neg Hx    Stomach cancer Neg Hx    Rectal cancer Neg Hx     Social History Social History   Tobacco Use   Smoking status: Never   Smokeless tobacco: Never  Vaping Use   Vaping status: Never Used  Substance Use Topics   Alcohol use: Not Currently    Comment: occasional   Drug use: No     Allergies   Patient has no known allergies.   Review of Systems Review of Systems Per HPI  Physical Exam Triage Vital Signs ED Triage Vitals [08/01/24 0853]  Encounter Vitals Group     BP (!) 149/94     Girls Systolic BP Percentile      Girls Diastolic BP Percentile      Boys Systolic BP Percentile      Boys Diastolic BP Percentile      Pulse Rate 71     Resp 16     Temp 98.3 F (36.8 C)     Temp Source Oral     SpO2 94 %     Weight      Height      Head Circumference      Peak Flow      Pain Score 0     Pain Loc      Pain Education      Exclude from Growth Chart    No data found.  Updated Vital Signs BP (!) 143/89 (BP Location: Right Arm)   Pulse 71   Temp 98.3 F (36.8 C) (Oral)   Resp 16   SpO2 94%   Visual Acuity Right Eye Distance:   Left Eye Distance:   Bilateral Distance:    Right Eye Near:   Left Eye Near:    Bilateral Near:     Physical  Exam Vitals and nursing note reviewed.   Constitutional:      Appearance: He is not ill-appearing or toxic-appearing.  HENT:     Head: Normocephalic and atraumatic.     Right Ear: Hearing and external ear normal.     Left Ear: Hearing and external ear normal.     Nose: Nose normal.     Mouth/Throat:     Lips: Pink.  Eyes:     General: Lids are normal. Vision grossly intact. Gaze aligned appropriately.     Extraocular Movements: Extraocular movements intact.     Conjunctiva/sclera: Conjunctivae normal.  Pulmonary:     Effort: Pulmonary effort is normal.  Musculoskeletal:     Cervical back: Neck supple.  Skin:    General: Skin is warm and dry.     Capillary Refill: Capillary refill takes less than 2 seconds.     Findings: Rash present. Rash is papular.     Comments: Erythematous papular rash to the posterior distal right thigh and bilateral upper EXTR with clustered lesions on erythematous base.   Neurological:     General: No focal deficit present.     Mental Status: He is alert and oriented to person, place, and time. Mental status is at baseline.     Cranial Nerves: No dysarthria or facial asymmetry.  Psychiatric:        Mood and Affect: Mood normal.        Speech: Speech normal.        Behavior: Behavior normal.        Thought Content: Thought content normal.        Judgment: Judgment normal.    Right posterior knee   Right arm   Left arm    UC Treatments / Results  Labs (all labs ordered are listed, but only abnormal results are displayed) Labs Reviewed - No data to display  EKG   Radiology No results found.  Procedures Procedures (including critical care time)  Medications Ordered in UC Medications - No data to display  Initial Impression / Assessment and Plan / UC Course  I have reviewed the triage vital signs and the nursing notes.  Pertinent labs & imaging results that were available during my care of the patient were reviewed by me and considered in my medical decision making (see  chart for details).   1.  Rhus dermatitis Presentation is consistent with contact dermatitis likely due to poison ivy. Will treat with prednisone  40 mg once daily for 5 days. Advised to take with food to avoid stomach upset. May use calamine lotion over-the-counter and hydrocortisone 1% over-the-counter as needed for itching. No signs of secondary bacterial infection on exam currently, however infection return precautions discussed.  Counseled patient on potential for adverse effects with medications prescribed/recommended today, strict ER and return-to-clinic precautions discussed, patient verbalized understanding.    Final Clinical Impressions(s) / UC Diagnoses   Final diagnoses:  Rhus dermatitis     Discharge Instructions      Take prednisone  once daily for the next 5 days. This will be 2 pills (20mg  each) totaling 40mg  daily for 5 days each morning with breakfast.  Purchase calamine lotion over the counter and apply as needed to the forearms to dry out rash.  Avoid scratching rash.  Watch for signs of secondary bacterial infection as discussed such as new/worsening redness, swelling, pus, pain, or fever/chills.  If you develop any new or worsening symptoms or if your symptoms do not start to improve, please  return here or follow-up with your primary care provider. If your symptoms are severe, please go to the emergency room.    ED Prescriptions     Medication Sig Dispense Auth. Provider   predniSONE  (DELTASONE ) 20 MG tablet Take 2 tablets (40 mg total) by mouth daily with breakfast for 5 days. 10 tablet Enedelia Dorna HERO, FNP      PDMP not reviewed this encounter.   Enedelia Dorna [place], [place] 08/01/24 [phone]

## 2024-08-01 NOTE — ED Triage Notes (Signed)
 Pt presents with poison ivy on bilateral arms and leg. States he came in contact last Sunday.

## 2024-08-01 NOTE — Discharge Instructions (Signed)
 Take prednisone once daily for the next 5 days. This will be 2 pills (20mg  each) totaling 40mg  daily for 5 days each morning with breakfast.  Purchase calamine lotion over the counter and apply as needed to the forearms to dry out rash.  Avoid scratching rash.  Watch for signs of secondary bacterial infection as discussed such as new/worsening redness, swelling, pus, pain, or fever/chills.  If you develop any new or worsening symptoms or if your symptoms do not start to improve, please return here or follow-up with your primary care provider. If your symptoms are severe, please go to the emergency room.

## 2024-08-15 ENCOUNTER — Other Ambulatory Visit: Payer: Self-pay | Admitting: Cardiology

## 2024-08-15 DIAGNOSIS — I2510 Atherosclerotic heart disease of native coronary artery without angina pectoris: Secondary | ICD-10-CM

## 2024-08-16 ENCOUNTER — Other Ambulatory Visit: Payer: Self-pay | Admitting: Cardiology

## 2024-08-16 DIAGNOSIS — I2510 Atherosclerotic heart disease of native coronary artery without angina pectoris: Secondary | ICD-10-CM

## 2024-08-31 ENCOUNTER — Other Ambulatory Visit: Payer: Self-pay | Admitting: Medical

## 2024-09-13 ENCOUNTER — Other Ambulatory Visit: Payer: Self-pay | Admitting: Cardiology

## 2024-09-13 DIAGNOSIS — I2510 Atherosclerotic heart disease of native coronary artery without angina pectoris: Secondary | ICD-10-CM

## 2024-10-11 ENCOUNTER — Other Ambulatory Visit: Payer: Self-pay | Admitting: Cardiology

## 2024-10-11 DIAGNOSIS — I2510 Atherosclerotic heart disease of native coronary artery without angina pectoris: Secondary | ICD-10-CM

## 2024-10-22 ENCOUNTER — Other Ambulatory Visit: Payer: Self-pay | Admitting: Cardiology

## 2024-10-22 ENCOUNTER — Telehealth: Payer: Self-pay | Admitting: Cardiology

## 2024-10-22 DIAGNOSIS — I2510 Atherosclerotic heart disease of native coronary artery without angina pectoris: Secondary | ICD-10-CM

## 2024-10-22 MED ORDER — ICOSAPENT ETHYL 1 G PO CAPS
2.0000 g | ORAL_CAPSULE | Freq: Two times a day (BID) | ORAL | 0 refills | Status: DC
Start: 2024-10-22 — End: 2024-10-26

## 2024-10-22 NOTE — Telephone Encounter (Signed)
 Pt scheduled to see Dr. Elm[place] in [place] 12/24/24.  Refill sent in.

## 2024-10-22 NOTE — Telephone Encounter (Signed)
*  STAT* If patient is at the pharmacy, call can be transferred to refill team.   1. Which medications need to be refilled? (please list name of each medication and dose if known) icosapent  Ethyl (VASCEPA ) 1 g capsule    2. Would you like to learn more about the convenience, safety, & potential cost savings by using the [facility]?    3. Are you open to using the [facility] (Type [facility].  ).   4. Which pharmacy/location (including street and city if local pharmacy) is medication to be sent to?  [id number] - [place], [place] - [address]       5. Do they need a 30 day or 90 day supply? 90 day

## 2024-11-03 ENCOUNTER — Other Ambulatory Visit (INDEPENDENT_AMBULATORY_CARE_PROVIDER_SITE_OTHER)

## 2024-11-03 ENCOUNTER — Encounter: Payer: Self-pay | Admitting: Physician Assistant

## 2024-11-03 ENCOUNTER — Ambulatory Visit: Admitting: Physician Assistant

## 2024-11-03 ENCOUNTER — Other Ambulatory Visit: Payer: Self-pay | Admitting: Radiology

## 2024-11-03 DIAGNOSIS — M1711 Unilateral primary osteoarthritis, right knee: Secondary | ICD-10-CM

## 2024-11-03 DIAGNOSIS — M1712 Unilateral primary osteoarthritis, left knee: Secondary | ICD-10-CM

## 2024-11-03 DIAGNOSIS — M170 Bilateral primary osteoarthritis of knee: Secondary | ICD-10-CM

## 2024-11-03 MED ORDER — LIDOCAINE HCL 1 % IJ SOLN
3.0000 mL | INTRAMUSCULAR | Status: AC | PRN
  Administered 2024-11-03: 3 mL

## 2024-11-03 MED ORDER — METHYLPREDNISOLONE ACETATE 40 MG/ML IJ SUSP
40.0000 mg | INTRAMUSCULAR | Status: AC | PRN
  Administered 2024-11-03: 40 mg via INTRA_ARTICULAR

## 2024-11-03 NOTE — Progress Notes (Signed)
 HPI: [name] comes in today due to left knee pain.  Last he was seen on 07/08/2023 and was given viscosupplementation injection left knee.  Comes in today requesting cortisone injection of the left knee.  He is diabetic but is last glucose levels were under good control.  He has also lost some 33 pounds.  He is having some right knee pain.  He has had no new injury to either knee.  Review of systems: Negative for fevers chills  Physical exam: General Well-developed well-developed male in no acute distress.  Ambulates without any assistive device. Bilateral knees: Good range of motion both knees no abnormal warmth erythema.  No instability valgus varus stressing.  Patellofemoral crepitus bilaterally with passive range of motion.  Tenderness over the medial joint line of both knees.  Impression: Left knee osteoarthritis Right knee osteoarthritis  Plan: Per his wishes we did inject both knees with cortisone today.  He is to be mindful of his glucose levels over the next 24 to 48 hours.  Will work on gaining approval for viscosupplementation for his left knee.  He has had these in the past and they have been beneficial.  Cortisone in the past has been very short-lived.  He has no upcoming surgery on either knee in the next 6 months.   Procedure Note  Patient: Steven Fletcher             Date of Birth: [birth year]           MRN: [id number]             Visit Date: 11/03/2024  Procedures: Visit Diagnoses:  1. Primary osteoarthritis of left knee     Large Joint Inj: bilateral knee on 11/03/2024 12:11 PM Indications: pain Details: 22 G 1.5 in needle, anterolateral approach  Arthrogram: No  Medications (Right): 3 mL lidocaine  1 %; 40 mg methylPREDNISolone  acetate 40 MG/ML Medications (Left): 3 mL lidocaine  1 %; 40 mg methylPREDNISolone  acetate 40 MG/ML Outcome: tolerated well, no immediate complications Procedure, treatment alternatives, risks and benefits explained, specific risks discussed.  Consent was given by the patient. Immediately prior to procedure a time out was called to verify the correct patient, procedure, equipment, support staff and site/side marked as required. Patient was prepped and draped in the usual sterile fashion.                Radiographs Left knee 2 views shows tricompartmental arthritis with near bone-on-bone medial compartment and moderate patellofemoral arthritis minimal osteophytes off the lateral joint line.  Knee is well located.  No acute fractures acute findings.  Right knee on the AP view shows moderate to moderate severe narrowing medial compartment.  Mild lateral compartmental changes.

## 2024-11-08 ENCOUNTER — Other Ambulatory Visit: Payer: Self-pay | Admitting: Medical

## 2024-11-08 DIAGNOSIS — E118 Type 2 diabetes mellitus with unspecified complications: Secondary | ICD-10-CM

## 2024-11-08 DIAGNOSIS — R7989 Other specified abnormal findings of blood chemistry: Secondary | ICD-10-CM

## 2024-11-18 ENCOUNTER — Other Ambulatory Visit

## 2024-11-18 DIAGNOSIS — E118 Type 2 diabetes mellitus with unspecified complications: Secondary | ICD-10-CM

## 2024-11-18 DIAGNOSIS — R7989 Other specified abnormal findings of blood chemistry: Secondary | ICD-10-CM

## 2024-11-20 LAB — TESTOSTERONE,FREE AND TOTAL
Testosterone, Free: 5.1 pg/mL — AB (ref 6.6–18.1)
Testosterone: 345 ng/dL (ref 264–916)

## 2024-11-20 LAB — HEMOGLOBIN A1C
Est. average glucose Bld gHb Est-mCnc: 137 mg/dL
Hgb A1c MFr Bld: 6.4 % — ABNORMAL HIGH (ref 4.8–5.6)

## 2024-11-26 ENCOUNTER — Telehealth: Payer: Self-pay | Admitting: Pharmacy Technician

## 2024-11-26 NOTE — Telephone Encounter (Signed)
 Pharmacy Patient Advocate Encounter   Received notification from Onbase that prior authorization for Mounjaro  2.5MG /0.5ML auto-injectors is due for renewal.   Insurance verification completed.   The patient is insured through [facility].  Action: Medication has been discontinued. Archived Key: [id number]

## 2024-11-27 ENCOUNTER — Other Ambulatory Visit: Payer: Self-pay | Admitting: Medical

## 2024-11-27 DIAGNOSIS — E782 Mixed hyperlipidemia: Secondary | ICD-10-CM

## 2024-11-27 DIAGNOSIS — E781 Pure hyperglyceridemia: Secondary | ICD-10-CM

## 2024-11-29 ENCOUNTER — Ambulatory Visit (INDEPENDENT_AMBULATORY_CARE_PROVIDER_SITE_OTHER): Admitting: Medical

## 2024-11-29 VITALS — BP 122/78 | HR 89 | Wt 229.0 lb

## 2024-11-29 DIAGNOSIS — E291 Testicular hypofunction: Secondary | ICD-10-CM

## 2024-11-29 DIAGNOSIS — E118 Type 2 diabetes mellitus with unspecified complications: Secondary | ICD-10-CM | POA: Diagnosis not present

## 2024-11-29 DIAGNOSIS — Z23 Encounter for immunization: Secondary | ICD-10-CM | POA: Diagnosis not present

## 2024-11-29 DIAGNOSIS — I10 Essential (primary) hypertension: Secondary | ICD-10-CM | POA: Diagnosis not present

## 2024-11-29 DIAGNOSIS — E781 Pure hyperglyceridemia: Secondary | ICD-10-CM | POA: Diagnosis not present

## 2024-11-29 DIAGNOSIS — Z113 Encounter for screening for infections with a predominantly sexual mode of transmission: Secondary | ICD-10-CM | POA: Diagnosis not present

## 2024-11-29 DIAGNOSIS — R7989 Other specified abnormal findings of blood chemistry: Secondary | ICD-10-CM | POA: Diagnosis not present

## 2024-11-29 DIAGNOSIS — N529 Male erectile dysfunction, unspecified: Secondary | ICD-10-CM

## 2024-11-29 DIAGNOSIS — E782 Mixed hyperlipidemia: Secondary | ICD-10-CM

## 2024-11-29 MED ORDER — SILDENAFIL CITRATE 100 MG PO TABS: 50.0000 mg | ORAL_TABLET | Freq: Every day | ORAL | 0 refills | Status: AC | PRN

## 2024-11-29 NOTE — Progress Notes (Signed)
 "  Name: Steven Fletcher   Date of Visit: 11/29/2024   Date of last visit with me: 11/27/2024   CHIEF COMPLAINT:  Chief Complaint  Patient presents with   Consult    Consult to discuss ED       HPI:  Discussed the use of AI scribe software for clinical note transcription with the patient, who gave verbal consent to proceed.  History of Present Illness   Steven Fletcher is a 63 year old male who presents with concerns about erectile dysfunction.  He experiences intermittent erectile dysfunction, with occasional successful erections, particularly in the morning or with self-stimulation. His libido is present, but he describes sometimes incomplete erections. He has not tried medications like Viagra or Cialis before. He has a new partner after many years of celibacy.   Also wants baseline STD screen.  He has a history of low testosterone , with recent levels at 345, an improvement from previous levels below 200. He attributes some improvement to a recent weight loss of 30 pounds. He has previously tried testosterone  treatments, but did not notice significant improvement.  He has borderline diabetes, with a recent screening showing an HbA1c level of 6.4. He is currently on metformin  for this condition. He also has a history of high cholesterol, for which he takes aspirin , and cholesterol medications  His partner is gluten intolerant and has prediabetes, which has led him to learn cooking for her dietary needs, contributing to his weight loss. He is currently taking aspirin , Zyrtec, a cholesterol medication, and metformin . He previously took Lamisil , an antifungal medication, but has since discontinued it.  No other aggravating or relieving factors. No other complaint.  Past Medical History:  Diagnosis Date   Agatston coronary artery calcium  score greater than 400 08/21/2020   Allergy    seasonal    Arthritis of left knee    Back pain    intermittent pain, sees Dr. Jame, Chiropractic    Family history of premature CAD    Hypertension    Impaired fasting blood sugar    Mixed dyslipidemia 2005   hereditary   Obesity    Wears glasses    Medications Ordered Prior to Encounter[1]  ROS as in subjective   Objective: BP 122/78   Pulse 89   Wt 229 lb (103.9 kg)   BMI 31.06 kg/m   General appearence: alert, no distress, WD/WN,  Heart: RRR, normal S1, S2, no murmurs Lungs: CTA bilaterally, no wheezes, rhonchi, or rales Pulses: 2+ symmetric, upper and lower extremities, normal cap refill   Assessment: Encounter Diagnoses  Name Primary?   Erectile dysfunction, unspecified erectile dysfunction type Yes   Need for Tdap vaccination    Screen for STD (sexually transmitted disease)    Essential hypertension    Hypertriglyceridemia    Hypogonadism in male    Low testosterone     Mixed hyperlipidemia    Type II diabetes mellitus with complication (HCC)      Plan: Erectile Dysfunction - Reviewed pathophysiology and differential diagnosis of erectile dysfunction with the patient.  Discussed treatment options.  Begin trial of Viagra.  Discussed potential risks of medications including hypotension and priapism.  Discussed proper use of medication.    Also discussed other treatments options, vacuum erection device, Cialis, injectables, other.  Questions were answered.    Discussed hx/o low testosterone , improved recently from weight loss and healthy lifestyle.  He has been on topical gels in the past.  Consider trial again of therapy.  Discussed the numerous options for therapy.   Continue current therapies for underlying issues, diabetes, HTN, hyperlipidemia   Counseled on the Tdap (tetanus, diptheria, and acellular pertussis) vaccine.  Vaccine information sheet given. Tdap vaccine given after consent obtained.   Steven Fletcher was seen today for consult.  Diagnoses and all orders for this visit:  Erectile dysfunction, unspecified erectile dysfunction type  Need for Tdap  vaccination -     Tdap vaccine greater than or equal to 7yo IM  Screen for STD (sexually transmitted disease) -     HIV Antibody (routine testing w rflx) -     RPR W/RFLX TO RPR TITER, TREPONEMAL AB, SCREEN AND DIAGNOSIS -     GC/Chlamydia Probe Amp -     Hepatitis C antibody -     Hepatitis B surface antigen  Essential hypertension  Hypertriglyceridemia  Hypogonadism in male  Low testosterone   Mixed hyperlipidemia  Type II diabetes mellitus with complication (HCC)  Other orders -     sildenafil (VIAGRA) 100 MG tablet; Take 0.5-1 tablets (50-100 mg total) by mouth daily as needed for erectile dysfunction.    F/u pending labs      [1]  Current Outpatient Medications on File Prior to Visit  Medication Sig Dispense Refill   aspirin  (ASPIRIN  LOW DOSE) 81 MG EC tablet Take 1 tablet (81 mg total) by mouth daily. Swallow whole. 90 tablet 3   cetirizine (ZYRTEC) 10 MG chewable tablet Chew 10 mg by mouth daily.     icosapent  Ethyl (VASCEPA ) 1 g capsule TAKE 2 CAPSULES(2 GRAMS) BY MOUTH TWICE DAILY 360 capsule 0   metFORMIN  (GLUCOPHAGE ) 500 MG tablet TAKE 1 TABLET(500 MG) BY MOUTH TWICE DAILY 60 tablet 2   terbinafine  (LAMISIL ) 250 MG tablet Take 1 tablet (250 mg total) by mouth daily. 45 tablet 0   fenofibrate  (TRICOR ) 145 MG tablet TAKE 1 TABLET(145 MG) BY MOUTH DAILY 90 tablet 2   rosuvastatin  (CRESTOR ) 40 MG tablet TAKE 1 TABLET(40 MG) BY MOUTH DAILY 90 tablet 2   No current facility-administered medications on file prior to visit.   "

## 2024-11-29 NOTE — Addendum Note (Signed)
 Addended by: VICCI HUSBAND A on: 11/29/2024 05:03 PM   Modules accepted: Orders

## 2024-11-30 ENCOUNTER — Ambulatory Visit: Payer: Self-pay | Admitting: Medical

## 2024-11-30 LAB — HEPATITIS C ANTIBODY: Hep C Virus Ab: NONREACTIVE

## 2024-11-30 LAB — HEPATITIS B SURFACE ANTIGEN: Hepatitis B Surface Ag: NEGATIVE

## 2024-11-30 LAB — HIV ANTIBODY (ROUTINE TESTING W REFLEX): HIV Screen 4th Generation wRfx: NONREACTIVE

## 2024-11-30 LAB — SYPHILIS: RPR W/REFLEX TO RPR TITER AND TREPONEMAL ANTIBODIES, TRADITIONAL SCREENING AND DIAGNOSIS ALGORITHM: RPR Ser Ql: NONREACTIVE

## 2024-11-30 NOTE — Progress Notes (Signed)
 Results thru my chart  Watch for the chlamydia and gonorrhea tests to be resulted in a few days

## 2024-12-03 ENCOUNTER — Other Ambulatory Visit

## 2024-12-03 ENCOUNTER — Other Ambulatory Visit: Payer: Self-pay

## 2024-12-03 DIAGNOSIS — R350 Frequency of micturition: Secondary | ICD-10-CM

## 2024-12-04 LAB — URINALYSIS, ROUTINE W REFLEX MICROSCOPIC
Bilirubin, UA: NEGATIVE
Glucose, UA: NEGATIVE
Ketones, UA: NEGATIVE
Leukocytes,UA: NEGATIVE
Nitrite, UA: NEGATIVE
RBC, UA: NEGATIVE
Specific Gravity, UA: 1.011 (ref 1.005–1.030)
Urobilinogen, Ur: 0.2 mg/dL (ref 0.2–1.0)
pH, UA: 7 (ref 5.0–7.5)

## 2024-12-04 LAB — MICROSCOPIC EXAMINATION
Bacteria, UA: NONE SEEN
Casts: NONE SEEN /LPF
Epithelial Cells (non renal): NONE SEEN /HPF (ref 0–10)
RBC, Urine: NONE SEEN /HPF (ref 0–2)
WBC, UA: NONE SEEN /HPF (ref 0–5)

## 2024-12-09 ENCOUNTER — Ambulatory Visit: Payer: Self-pay | Admitting: Medical

## 2024-12-09 ENCOUNTER — Other Ambulatory Visit: Payer: Self-pay | Admitting: Medical

## 2024-12-09 DIAGNOSIS — R809 Proteinuria, unspecified: Secondary | ICD-10-CM

## 2024-12-09 NOTE — Progress Notes (Signed)
 Results through MyChart

## 2024-12-11 ENCOUNTER — Other Ambulatory Visit: Payer: Self-pay | Admitting: Medical

## 2024-12-24 ENCOUNTER — Ambulatory Visit: Attending: Cardiology | Admitting: Cardiology

## 2024-12-24 ENCOUNTER — Encounter: Payer: Self-pay | Admitting: Cardiology

## 2024-12-24 VITALS — BP 132/90 | HR 82 | Ht 74.0 in | Wt 232.5 lb

## 2024-12-24 DIAGNOSIS — E782 Mixed hyperlipidemia: Secondary | ICD-10-CM

## 2024-12-24 DIAGNOSIS — I10 Essential (primary) hypertension: Secondary | ICD-10-CM

## 2024-12-24 DIAGNOSIS — E781 Pure hyperglyceridemia: Secondary | ICD-10-CM

## 2024-12-24 DIAGNOSIS — I2510 Atherosclerotic heart disease of native coronary artery without angina pectoris: Secondary | ICD-10-CM | POA: Diagnosis not present

## 2024-12-24 MED ORDER — ICOSAPENT ETHYL 1 G PO CAPS: 2.0000 g | ORAL_CAPSULE | Freq: Two times a day (BID) | ORAL | 3 refills | Status: AC

## 2024-12-24 MED ORDER — FENOFIBRATE 145 MG PO TABS: ORAL_TABLET | ORAL | 3 refills | Status: AC

## 2024-12-24 MED ORDER — ASPIRIN 81 MG PO TBEC: 81.0000 mg | DELAYED_RELEASE_TABLET | Freq: Every day | ORAL | 3 refills | Status: AC

## 2024-12-24 MED ORDER — ROSUVASTATIN CALCIUM 40 MG PO TABS: ORAL_TABLET | ORAL | 3 refills | Status: AC

## 2024-12-24 NOTE — Patient Instructions (Signed)
 Medication Instructions:  Refills sent in today   *If you need a refill on your cardiac medications before your next appointment, please call your pharmacy*  Lab Work: Lipid panel LP(a) Cbc Bmp  If you have labs (blood work) drawn today and your tests are completely normal, you will receive your results only by: MyChart Message (if you have MyChart) OR A paper copy in the mail If you have any lab test that is abnormal or we need to change your treatment, we will call you to review the results.  Testing/Procedures: Echocardiogram  Your physician has requested that you have an echocardiogram. Echocardiography is a painless test that uses sound waves to create images of your heart. It provides your doctor with information about the size and shape of your heart and how well your hearts chambers and valves are working. This procedure takes approximately one hour. There are no restrictions for this procedure. Please do NOT wear cologne, perfume, aftershave, or lotions (deodorant is allowed). Please arrive 15 minutes prior to your appointment time.  Please note: We ask at that you not bring children with you during ultrasound (echo/ vascular) testing. Due to room size and safety concerns, children are not allowed in the ultrasound rooms during exams. Our front office staff cannot provide observation of children in our lobby area while testing is being conducted. An adult accompanying a patient to their appointment will only be allowed in the ultrasound room at the discretion of the ultrasound technician under special circumstances. We apologize for any inconvenience.   Follow-Up: At [facility], you and your health needs are our priority.  As part of our continuing mission to provide you with exceptional heart care, our providers are all part of one team.  This team includes your primary Cardiologist (physician) and Advanced Practice Providers or APPs (Physician Assistants and Nurse  Practitioners) who all work together to provide you with the care you need, when you need it.  Your next appointment:   1 year(s)  Provider:   Newman JINNY Lawrence, MD

## 2024-12-24 NOTE — Progress Notes (Signed)
 " Cardiology Office Note:  .   Date:  12/24/2024  ID:  Steven Fletcher, DOB 1961/09/26, MRN [id number] PCP: Bulah Alm RAMAN, PA-C  [facility] Providers Cardiologist:  Newman Lawrence, MD PCP: Bulah Alm RAMAN, PA-C  Chief Complaint  Patient presents with   Coronary Artery Disease     Steven Fletcher is a 64 y.o. male with hyperlipidemia, prediabetes, obesity, family h/o premature CAD, elevated calcium  score (08/2020), no ischemia on stress test (09/2020)  Discussed the use of AI scribe software for clinical note transcription with the patient, who gave verbal consent to proceed.  History of Present Illness  Steven Fletcher is a 64 year old male with coronary artery disease who presents for follow-up of his cardiac health. He was referred by his primary care physician, Steven Fletcher, for ongoing management of coronary artery disease.  He was last seen in 2024 after a 2021 coronary CT showed a high coronary calcium  score, with a normal stress test at that time. He has been taking aspirin , rosuvastatin , Vascepa , and metformin , and recently started Trulicity for weight management. He walks up to four to five miles, especially on weekends, without chest pain or shortness of breath.  He takes his medications consistently and has no bleeding. He saw his primary care physician about a month ago. His cholesterol was not checked then, and his last lipid panel was about a year ago. He has borderline prediabetes with a hemoglobin A1c of 6.4 on metformin , and his kidney function has been reported as good.  He has not had an echocardiogram. His 2021 stress test showed good heart function. He has no leg swelling. He asks about using Viagra , which he has not yet taken, and confirms he is not using it with nitroglycerin.      Vitals:   12/24/24 0816  BP: (!) 132/90  Pulse: 82  SpO2: 95%      Review of Systems  Cardiovascular:  Negative for chest pain, dyspnea on exertion, leg  swelling, palpitations and syncope.        Studies Reviewed: Steven Fletcher        EKG 12/24/2024: Normal sinus rhythm Normal ECG No previous ECGs available    CT cardiac scoring 08/03/2020: Total score: 626 LM: 0 LAD: 399 LCx: 3 RCA: 224  Labs 2-03/2024: Chol 123, TG 145, HDL 33, LDL 65 HbA1C 6.4% Hb 15.3 Cr 0.88 TSH 3.2  2021: Chol 179, TG 384, HDL 31, LDL 85     Physical Exam Vitals and nursing note reviewed.  Constitutional:      General: He is not in acute distress. Neck:     Vascular: No JVD.  Cardiovascular:     Rate and Rhythm: Normal rate and regular rhythm.     Heart sounds: Normal heart sounds. No murmur heard. Pulmonary:     Effort: Pulmonary effort is normal.     Breath sounds: Normal breath sounds. No wheezing or rales.  Musculoskeletal:     Right lower leg: No edema.     Left lower leg: No edema.      VISIT DIAGNOSES:   ICD-10-CM   1. Coronary artery disease involving native coronary artery of native heart without angina pectoris  I25.10 EKG 12-Lead    2. Mixed hyperlipidemia  E78.2 EKG 12-Lead       Steven Fletcher is a 64 y.o. male with hyperlipidemia, prediabetes, obesity, family h/o premature CAD, elevated calcium  score (08/2020), no ischemia on stress test (09/2020)  Assessment & Plan Atherosclerotic heart disease of native coronary artery without angina pectoris: Atherosclerotic heart disease with high coronary calcium  noted in 2021. No current angina or dyspnea. Blood pressure controlled with occasional diastolic elevation. - Continue aspirin , rosuvastatin , Vascepa , and fenofibrate . - Monitor blood pressure regularly. - Scheduled follow-up in one year for medication management and reassessment.  Mixed hyperlipidemia: Managed with rosuvastatin  and fenofibrate . Lipoprotein A not previously checked, fasting lipid panel needed. - Ordered fasting lipid panel including lipoprotein A. - Continue rosuvastatin  40 mg, fenofibrate  145 mg, vascepa  2  g bid.. - Sent medication refills to pharmacy.         Meds ordered this encounter  Medications   aspirin  EC (ASPIRIN  LOW DOSE) 81 MG tablet    Sig: Take 1 tablet (81 mg total) by mouth daily. Swallow whole.    Dispense:  90 tablet    Refill:  3   fenofibrate  (TRICOR ) 145 MG tablet    Sig: TAKE 1 TABLET(145 MG) BY MOUTH DAILY    Dispense:  90 tablet    Refill:  3   icosapent  Ethyl (VASCEPA ) 1 g capsule    Sig: Take 2 capsules (2 g total) by mouth 2 (two) times daily.    Dispense:  360 capsule    Refill:  3    Pt must keep upcoming appt in January 2026 with Dr. Elmira before anymore refills. Thank you Final Attempt   rosuvastatin  (CRESTOR ) 40 MG tablet    Sig: TAKE 1 TABLET(40 MG) BY MOUTH DAILY    Dispense:  90 tablet    Refill:  3     F/u in 1 year  Signed, Newman JINNY Elmira, MD  "

## 2025-01-06 LAB — CBC

## 2025-01-06 LAB — OPHTHALMOLOGY REPORT-SCANNED

## 2025-01-06 LAB — LIPID PANEL

## 2025-01-07 ENCOUNTER — Ambulatory Visit: Payer: Self-pay | Admitting: Cardiology

## 2025-01-07 LAB — CBC
Hematocrit: 47.1 (ref 37.5–51.0)
Hemoglobin: 15.6 g/dL (ref 13.0–17.7)
MCH: 28.9 pg (ref 26.6–33.0)
MCHC: 33.1 g/dL (ref 31.5–35.7)
MCV: 87 fL (ref 79–97)
Platelets: 316 10*3/uL (ref 150–450)
RBC: 5.39 x10E6/uL (ref 4.14–5.80)
RDW: 12.7 (ref 11.6–15.4)
WBC: 7.4 10*3/uL (ref 3.4–10.8)

## 2025-01-07 LAB — BASIC METABOLIC PANEL WITH GFR
BUN/Creatinine Ratio: 20 (ref 10–24)
BUN: 19 mg/dL (ref 8–27)
CO2: 21 mmol/L (ref 20–29)
Calcium: 10.2 mg/dL (ref 8.6–10.2)
Chloride: 102 mmol/L (ref 96–106)
Creatinine, Ser: 0.97 mg/dL (ref 0.76–1.27)
Glucose: 122 mg/dL — ABNORMAL HIGH (ref 70–99)
Potassium: 4.9 mmol/L (ref 3.5–5.2)
Sodium: 139 mmol/L (ref 134–144)
eGFR: 87 mL/min/{1.73_m2}

## 2025-01-07 LAB — LIPID PANEL
Cholesterol, Total: 147 mg/dL (ref 100–199)
HDL: 42 mg/dL
LDL CALC COMMENT:: 3.5 ratio (ref 0.0–5.0)
LDL Chol Calc (NIH): 82 mg/dL (ref 0–99)
Triglycerides: 126 mg/dL (ref 0–149)
VLDL Cholesterol Cal: 23 mg/dL (ref 5–40)

## 2025-01-07 LAB — LIPOPROTEIN A (LPA)

## 2025-01-07 NOTE — Progress Notes (Signed)
 LDL has gone up slightly compared to a year ago, now at 51. Goal LDL <70. If he has been compliant with medications and diet, recommend referral to lipid clinic to consider injectable agents. If there has been any noncompliance, recommend reassuring compliance and heart healthy diet, and repeat lipid panel in 2-3 months.  Thanks MJP

## 2025-01-20 ENCOUNTER — Ambulatory Visit: Admitting: Physician Assistant

## 2025-01-25 ENCOUNTER — Ambulatory Visit (HOSPITAL_COMMUNITY)
